# Patient Record
Sex: Female | Born: 1955
Health system: Southern US, Community
[De-identification: ages and names within clinical notes are randomized; demographics above are authoritative.]

## PROBLEM LIST (undated history)

## (undated) DIAGNOSIS — K219 Gastro-esophageal reflux disease without esophagitis: Secondary | ICD-10-CM

## (undated) HISTORY — DX: Gastro-esophageal reflux disease without esophagitis: K21.9

---

## 2010-05-20 HISTORY — PX: COLONOSCOPY: SHX174

## 2012-07-16 ENCOUNTER — Inpatient Hospital Stay (HOSPITAL_COMMUNITY): Admit: 2012-07-16 | Payer: Self-pay

## 2012-07-16 ENCOUNTER — Other Ambulatory Visit (HOSPITAL_COMMUNITY)
Admission: RE | Admit: 2012-07-16 | Discharge: 2012-07-16 | Disposition: A | Discharge status: Home / Self Care | Payer: 59 | Source: Ambulatory Visit | Attending: Obstetrics and Gynecology | Admitting: Obstetrics and Gynecology

## 2012-07-16 DIAGNOSIS — Z01419 Encounter for gynecological examination (general) (routine) without abnormal findings: Secondary | ICD-10-CM | POA: Insufficient documentation

## 2012-07-16 DIAGNOSIS — Z1151 Encounter for screening for human papillomavirus (HPV): Secondary | ICD-10-CM | POA: Insufficient documentation

## 2018-02-25 DIAGNOSIS — Z136 Encounter for screening for cardiovascular disorders: Secondary | ICD-10-CM | POA: Diagnosis not present

## 2018-02-25 DIAGNOSIS — Z0000 Encounter for general adult medical examination without abnormal findings: Secondary | ICD-10-CM | POA: Diagnosis not present

## 2018-04-01 DIAGNOSIS — H903 Sensorineural hearing loss, bilateral: Secondary | ICD-10-CM | POA: Diagnosis not present

## 2019-03-29 ENCOUNTER — Other Ambulatory Visit: Payer: Self-pay

## 2019-03-29 DIAGNOSIS — Z20822 Contact with and (suspected) exposure to covid-19: Secondary | ICD-10-CM

## 2019-03-30 LAB — NOVEL CORONAVIRUS, NAA: SARS-CoV-2, NAA: DETECTED — AB

## 2019-07-12 ENCOUNTER — Ambulatory Visit: Payer: Self-pay | Attending: Family

## 2019-07-12 DIAGNOSIS — Z23 Encounter for immunization: Secondary | ICD-10-CM

## 2019-07-12 NOTE — Progress Notes (Signed)
   Covid-19 Vaccination Clinic  Name:  Sandra Holt    MRN: [id number] DOB: 04-Jul-1955  07/12/2019  [name] was observed post Covid-19 immunization for 15 minutes without incidence. She was provided with Vaccine Information Sheet and instruction to access the V-Safe system.   [name] was instructed to call 911 with any severe reactions post vaccine: Marland Kitchen Difficulty breathing  . Swelling of your face and throat  . A fast heartbeat  . A bad rash all over your body  . Dizziness and weakness    Immunizations Administered    Name Date Dose VIS Date Route   Moderna COVID-19 Vaccine 07/12/2019  5:22 PM 0.5 mL 04/20/2019 Intramuscular   Manufacturer: Moderna   Lot: [id number]   NDC: 33825-053-97

## 2019-08-24 ENCOUNTER — Ambulatory Visit: Payer: Self-pay | Attending: Family

## 2019-08-24 DIAGNOSIS — Z23 Encounter for immunization: Secondary | ICD-10-CM

## 2019-08-24 NOTE — Progress Notes (Signed)
   Covid-19 Vaccination Clinic  Name:  Sandra Holt    MRN: [id number] DOB: March 18, 1956  08/24/2019  [name] was observed post Covid-19 immunization for 15 minutes without incident. She was provided with Vaccine Information Sheet and instruction to access the V-Safe system.   [name] was instructed to call 911 with any severe reactions post vaccine: Marland Kitchen Difficulty breathing  . Swelling of face and throat  . A fast heartbeat  . A bad rash all over body  . Dizziness and weakness   Immunizations Administered    Name Date Dose VIS Date Route   Moderna COVID-19 Vaccine 08/24/2019 12:12 PM 0.5 mL 04/20/2019 Intramuscular   Manufacturer: Moderna   Lot: [id number]   NDC: 52080-223-36

## 2019-12-31 ENCOUNTER — Encounter: Payer: Self-pay | Admitting: Gastroenterology

## 2020-02-04 ENCOUNTER — Encounter: Payer: Self-pay | Admitting: Internal Medicine

## 2020-02-04 ENCOUNTER — Ambulatory Visit (INDEPENDENT_AMBULATORY_CARE_PROVIDER_SITE_OTHER): Payer: 59 | Admitting: Internal Medicine

## 2020-02-04 ENCOUNTER — Other Ambulatory Visit: Payer: Self-pay

## 2020-02-04 VITALS — BP 122/82 | HR 86 | Temp 98.9°F | Ht 62.0 in | Wt 154.7 lb

## 2020-02-04 DIAGNOSIS — K219 Gastro-esophageal reflux disease without esophagitis: Secondary | ICD-10-CM | POA: Insufficient documentation

## 2020-02-04 DIAGNOSIS — R5383 Other fatigue: Secondary | ICD-10-CM | POA: Diagnosis not present

## 2020-02-04 MED ORDER — PANTOPRAZOLE SODIUM 40 MG PO TBEC: 40.0000 mg | DELAYED_RELEASE_TABLET | Freq: Every day | ORAL | 1 refills | Status: DC

## 2020-02-04 NOTE — Patient Instructions (Signed)
-  Gusto en conocerla!  -Haga cita para volver an ayunas para su examen fisico y laboratorios.  -Tomese el protonix de 40 mg una vez al dia en ayunas.

## 2020-02-04 NOTE — Progress Notes (Signed)
New Patient Office Visit     This visit occurred during the SARS-CoV-2 public health emergency.  Safety protocols were in place, including screening questions prior to the visit, additional usage of staff PPE, and extensive cleaning of exam room while observing appropriate contact time as indicated for disinfecting solutions.    CC/Reason for Visit: Establish care, discuss some acute concerns Previous PCP: None Last Visit: Unknown  HPI: Sandra Holt is a 64 y.o. female who is coming in today for the above mentioned reasons. She has no past medical history of significance. She has been having some lower back issues for years. No radiculopathy. For the last 8 to 10 months she has been complaining of increased epigastric pain with reflux. She has been using as needed PPI without significant relief. She is mainly Spanish-speaking, from the [place]. She had Covid in November 2020 and has been extremely fatigued ever since. She is overdue for many immunizations, all cancer screenings and lab work.   Past Medical/Surgical History: Past Medical History:  Diagnosis Date  . GERD (gastroesophageal reflux disease)     History reviewed. No pertinent surgical history.  Social History:  reports that she has never smoked. She has never used smokeless tobacco. She reports that she does not drink alcohol and does not use drugs.  Allergies: No Known Allergies  Family History:  No history of heart disease, cancer, stroke that she is aware of  Current Outpatient Medications:  Marland Kitchen  Multiple Vitamins-Minerals (CENTRUM WOMEN PO), Take by mouth., Disp: , Rfl:  .  pantoprazole (PROTONIX) 40 MG tablet, Take 1 tablet (40 mg total) by mouth daily., Disp: 90 tablet, Rfl: 1  Review of Systems:  Constitutional: Denies fever, chills, diaphoresis, appetite change and fatigue.  HEENT: Denies photophobia, eye pain, redness, hearing loss, ear pain, congestion, sore throat, rhinorrhea, sneezing,  mouth sores, trouble swallowing, neck pain, neck stiffness and tinnitus.   Respiratory: Denies SOB, DOE, cough, chest tightness,  and wheezing.   Cardiovascular: Denies chest pain, palpitations and leg swelling.  Gastrointestinal: Denies nausea, vomiting,  diarrhea, constipation, blood in stool and abdominal distention.  Genitourinary: Denies dysuria, urgency, frequency, hematuria, flank pain and difficulty urinating.  Endocrine: Denies: hot or cold intolerance, sweats, changes in hair or nails, polyuria, polydipsia. Musculoskeletal: Denies  joint swelling, arthralgias and gait problem.  Skin: Denies pallor, rash and wound.  Neurological: Denies dizziness, seizures, syncope, weakness, light-headedness, numbness and headaches.  Hematological: Denies adenopathy. Easy bruising, personal or family bleeding history  Psychiatric/Behavioral: Denies suicidal ideation, mood changes, confusion, nervousness, sleep disturbance and agitation    Physical Exam: Vitals:   02/04/20 1302  BP: 122/82  Pulse: 86  Temp: 98.9 F (37.2 C)  TempSrc: Oral  SpO2: 97%  Weight: 154 lb 11.2 oz (70.2 kg)  Height: 5\' 2"  (1.575 m)   Body mass index is 28.3 kg/m.   Constitutional: NAD, calm, comfortable Eyes: PERRL, lids and conjunctivae normal ENMT: Mucous membranes are moist.  Respiratory: clear to auscultation bilaterally, no wheezing, no crackles. Normal respiratory effort. No accessory muscle use.  Cardiovascular: Regular rate and rhythm, no murmurs / rubs / gallops. No extremity edema.   Neurologic grossly intact and nonfocal Psychiatric: Normal judgment and insight. Alert and oriented x 3. Normal mood.    Impression and Plan:  Gastroesophageal reflux disease without esophagitis  -Start daily PPI therapy with Protonix 40 mg daily. -She will return for follow-up.  Fatigue, unspecified type -She will make a follow-up appointment for labs and  physical. -I am especially concerned about her  hemoglobin, TSH, vitamin D and vitamin B12 levels.     Patient Instructions  -Gusto en conocerla!  -Haga cita para volver an ayunas para su examen fisico y laboratorios.  -Tomese el protonix de 40 mg una vez al dia en ayunas.     Chaya Jan, MD [facility]

## 2020-02-08 ENCOUNTER — Telehealth: Payer: Self-pay | Admitting: *Deleted

## 2020-02-08 MED ORDER — OMEPRAZOLE 20 MG PO CPDR: DELAYED_RELEASE_CAPSULE | ORAL | 1 refills | Status: DC

## 2020-02-08 NOTE — Telephone Encounter (Signed)
Ok to switch to omeprazole 20 mg, but she will need to take 40 mg a day so #60 per month. Ok to send 180 with a refill.

## 2020-02-08 NOTE — Telephone Encounter (Signed)
pantoprazole (PROTONIX) 40 MG tablet is not covered by her insurance.  Preferred products are:  Omeprazole 20 mg caps Lansoprazole 30 mg caps Rabeprazole 20 mg caps esomepra mag 20 mg caps Esomeprazole 20 mg

## 2020-02-29 ENCOUNTER — Ambulatory Visit: Payer: Self-pay | Admitting: Gastroenterology

## 2020-03-01 ENCOUNTER — Ambulatory Visit (INDEPENDENT_AMBULATORY_CARE_PROVIDER_SITE_OTHER): Payer: 59 | Admitting: Internal Medicine

## 2020-03-01 ENCOUNTER — Other Ambulatory Visit: Payer: Self-pay

## 2020-03-01 ENCOUNTER — Encounter: Payer: Self-pay | Admitting: Internal Medicine

## 2020-03-01 ENCOUNTER — Other Ambulatory Visit (HOSPITAL_COMMUNITY)
Admission: RE | Admit: 2020-03-01 | Discharge: 2020-03-01 | Disposition: A | Discharge status: Home / Self Care | Payer: 59 | Source: Ambulatory Visit | Attending: Internal Medicine | Admitting: Internal Medicine

## 2020-03-01 VITALS — BP 130/80 | HR 80 | Temp 98.5°F | Resp 16 | Ht 62.0 in | Wt 151.6 lb

## 2020-03-01 DIAGNOSIS — R5383 Other fatigue: Secondary | ICD-10-CM

## 2020-03-01 DIAGNOSIS — K219 Gastro-esophageal reflux disease without esophagitis: Secondary | ICD-10-CM

## 2020-03-01 DIAGNOSIS — Z0000 Encounter for general adult medical examination without abnormal findings: Secondary | ICD-10-CM | POA: Diagnosis not present

## 2020-03-01 DIAGNOSIS — Z1231 Encounter for screening mammogram for malignant neoplasm of breast: Secondary | ICD-10-CM | POA: Diagnosis not present

## 2020-03-01 DIAGNOSIS — Z124 Encounter for screening for malignant neoplasm of cervix: Secondary | ICD-10-CM

## 2020-03-01 DIAGNOSIS — Z1211 Encounter for screening for malignant neoplasm of colon: Secondary | ICD-10-CM

## 2020-03-01 NOTE — Patient Instructions (Signed)
-Nice seeing you today!!  -Lab work today; will notify you once results are available.  -Mammogram and colonoscopy will be requested today.  -Shingles vaccine at your pharmacy.  -Schedule follow up in 6 months.   Cuidados preventivos en las mujeres de 20 a 30 aos de edad Preventive Care 1-63 Years Old, Female Los cuidados preventivos hacen referencia a las opciones en cuanto a las visitas al mdico y al estilo de vida, las cuales pueden promover la salud y Musician. Esto puede comprender lo siguiente:  Un examen fsico anual. Esto tambin se puede llamar control de bienestar anual.  Visitas regulares al dentista y exmenes oculares.  Vacunas.  Estudios para Engineer, building services.  Opciones saludables de estilo de vida, como seguir una dieta saludable, hacer ejercicio regularmente, no usar drogas ni productos que contengan nicotina y tabaco, y limitar el consumo de alcohol. Qu puedo esperar para mi visita de cuidado preventivo? Examen fsico El mdico revisar lo siguiente:  IT consultant y [place]. Esto se puede usar para calcular el ndice de masa corporal ([place]), que indica si tiene un peso saludable.  Frecuencia cardaca y presin arterial.  Piel para detectar manchas anormales. Asesoramiento Su mdico puede preguntarle acerca de:  Consumo de tabaco, alcohol y drogas.  Su bienestar emocional.  El bienestar en el hogar y sus relaciones personales.  Su actividad sexual.  Sus hbitos de alimentacin.  Su trabajo y [employer] laboral.  Mtodos anticonceptivos.  Su ciclo menstrual.  Sus antecedentes de Media planner. Qu vacunas necesito?  [place] antigripal  Se recomienda aplicarse esta vacuna todos los [place]. Vacuna contra el ttanos, difteria y tos ferina (Tdap)  Es posible que tenga que aplicarse un refuerzo contra el ttanos y la difteria (DT) cada 10aos. Vacuna contra la varicela  Es posible que tenga que aplicrsela si no recibi esta  vacuna. Vacuna contra el herpes zster (culebrilla)  Es posible que la necesite despus de los 38 aos de [place]. Vacuna contra el sarampin, rubola y paperas (SRP)  Es posible que necesite aplicarse al menos una dosis de la vacuna SRP si naci despus de [phone]. Podra tambin necesitar una segunda dosis. Vacuna antineumoccica conjugada (PCV13)  Puede necesitar esta vacuna si tiene determinadas enfermedades y no se vacun anteriormente. Edward Jolly antineumoccica de polisacridos (PPSV23)  Quizs tenga que aplicarse una o dos dosis si fuma o si tiene determinadas afecciones. Edward Jolly antimeningoccica conjugada (MenACWY)  Puede necesitar esta vacuna si tiene determinadas afecciones. Vacuna contra la hepatitis A  Es posible que necesite esta vacuna si tiene ciertas afecciones o si viaja o trabaja en lugares en los que podra estar expuesta a la hepatitis A. Vacuna contra la hepatitis B  Es posible que necesite esta vacuna si tiene ciertas afecciones o si viaja o trabaja en lugares en los que podra estar expuesta a la hepatitis B. Vacuna antihaemophilus influenzae tipo B (Hib)  Puede necesitar esta vacuna si tiene determinadas afecciones. Vacuna contra el virus del papiloma humano (VPH)  Si el mdico se lo recomienda, Research scientist (physical sciences) tres dosis a lo largo de 6 meses. Puede recibir las vacunas en forma de dosis individuales o en forma de dos o ms vacunas juntas en la misma inyeccin (vacunas combinadas). Hable con su mdico [employer] y beneficios de las vacunas combinadas. Qu pruebas necesito? Anlisis de [employer] de lpidos y colesterol. Estos se pueden verificar cada 5 aos o, con ms frecuencia, si usted tiene ms de 27 aos de edad.  Anlisis de hepatitisC.  Anlisis de hepatitisB. Pruebas de deteccin  Pruebas de deteccin de cncer de pulmn. Es posible que se le realice esta prueba de deteccin a partir de los 33 aos de edad, si ha fumado durante 30 aos un  paquete diario y sigue fumando o dej el hbito en algn momento en los ltimos 15 aos.  Pruebas de Programme researcher, broadcasting/film/video de Surveyor, minerals. Todos los adultos a partir de los 42 aos de edad y [place] 70 aos de edad deben hacerse esta prueba de deteccin. El mdico puede recomendarle las pruebas de deteccin a partir de los 40 aos de edad si corre un mayor riesgo. Le realizarn pruebas cada 1 a 10 aos, segn los [place] y el tipo de prueba de Programme researcher, broadcasting/film/video.  Pruebas de deteccin de la diabetes. Esto se Set designer un control del azcar en la sangre (glucosa) despus de no haber comido durante un periodo de tiempo (ayuno). Es posible que se le realice esta prueba cada 1 a 3 aos.  Mamografa. Se puede realizar cada 1 o 2 aos. Hable con su mdico sobre cundo debe comenzar a Engineer, manufacturing de [place] regular. Esto depende de si tiene antecedentes familiares de cncer de mama o no.  Pruebas de deteccin de cncer relacionado con las mutaciones del BRCA. Es posible que se las deba realizar si tiene antecedentes de cncer de mama, de ovario, de trompas o peritoneal.  Examen plvico y prueba de Papanicolaou. Esto se puede realizar cada 8aos a Renato Gails de los 21aos de edad. A partir de los 30 aos, esto se puede Optometrist cada 5 aos si usted se realiza una prueba de Papanicolaou en combinacin con una prueba de deteccin del virus del papiloma humano (VPH). Otras pruebas  Anlisis de enfermedades de transmisin sexual (ETS).  Densitometra sea. Esto se realiza para detectar osteoporosis. Se le puede realizar este examen de deteccin si tiene un riesgo alto de tener osteoporosis. Siga estas instrucciones en su casa: Comida y bebida  Siga una dieta que incluya frutas frescas y verduras, cereales integrales, lcteos descremados y protenas magras.  Tome los suplementos vitamnicos y [employer] se lo haya indicado el mdico.  No beba alcohol si: ? Su mdico le indica no hacerlo. ? Est  embarazada, puede estar embarazada o est tratando de quedar embarazada.  Si bebe alcohol: ? Limite la cantidad que consume de 0 a 1 medida por da. ? Est atenta a la cantidad de alcohol que hay en las bebidas que toma. En los [place], una medida equivale a una botella de cerveza de 12oz (367ml), un vaso de vino de 5oz (13ml) o un vaso de una bebida alcohlica de alta graduacin de 1oz (66ml). Estilo de [employer] y las encas a diario.  Mantngase activa. Haga al menos 61minutos de ejercicio 5o ms [employer].  No consuma ningn producto que contenga nicotina o tabaco, como cigarrillos, cigarrillos electrnicos y tabaco de Higher education careers adviser. Si necesita ayuda para dejar de fumar, consulte al mdico.  Si es sexualmente activa, practique sexo seguro. Use un condn u otra forma de mtodo anticonceptivo (anticonceptivos) a fin de Environmental health practitioner e ITS (infecciones de transmisin sexual).  Si el mdico se lo indic, tome una dosis baja de aspirina diariamente a partir de los 24 aos de [place]. Cundo volver?  Visite al mdico una vez al ao para una visita de control.  Pregntele al mdico con qu frecuencia debe realizarse un control de la vista y los dientes.  Mantenga su esquema de vacunacin al da. Esta informacin no tiene Marine scientist el consejo del mdico. Asegrese de hacerle al mdico cualquier pregunta que tenga. Document Revised: 02/19/2018 Document Reviewed: 02/19/2018 Elsevier Patient Education  [place].

## 2020-03-01 NOTE — Progress Notes (Signed)
Established Patient Office Visit     This visit occurred during the SARS-CoV-2 public health emergency.  Safety protocols were in place, including screening questions prior to the visit, additional usage of staff PPE, and extensive cleaning of exam room while observing appropriate contact time as indicated for disinfecting solutions.    CC/Reason for Visit: Annual preventive exam  HPI: Sandra Holt is a 64 y.o. female who is coming in today for the above mentioned reasons. Past Medical History is significant for: Recently diagnosed GERD who has started Protonix and is feeling much improved.  She is overdue for mammogram, Pap smear, colonoscopy.  She recently had her flu vaccine.  She has no acute complaints today.   Past Medical/Surgical History: Past Medical History:  Diagnosis Date  . GERD (gastroesophageal reflux disease)     No past surgical history on file.  Social History:  reports that she has never smoked. She has never used smokeless tobacco. She reports that she does not drink alcohol and does not use drugs.  Allergies: No Known Allergies  Family History:  No history of heart disease, cancer, stroke that she is aware of.  Current Outpatient Medications:  Marland Kitchen  Multiple Vitamins-Minerals (CENTRUM WOMEN PO), Take by mouth., Disp: , Rfl:  .  omeprazole (PRILOSEC) 20 MG capsule, Take 2 capsules daily, Disp: 180 capsule, Rfl: 1  Review of Systems:  Constitutional: Denies fever, chills, diaphoresis, appetite change and fatigue.  HEENT: Denies photophobia, eye pain, redness, hearing loss, ear pain, congestion, sore throat, rhinorrhea, sneezing, mouth sores, trouble swallowing, neck pain, neck stiffness and tinnitus.   Respiratory: Denies SOB, DOE, cough, chest tightness,  and wheezing.   Cardiovascular: Denies chest pain, palpitations and leg swelling.  Gastrointestinal: Denies nausea, vomiting, abdominal pain, diarrhea, constipation, blood in stool and  abdominal distention.  Genitourinary: Denies dysuria, urgency, frequency, hematuria, flank pain and difficulty urinating.  Endocrine: Denies: hot or cold intolerance, sweats, changes in hair or nails, polyuria, polydipsia. Musculoskeletal: Denies myalgias, back pain, joint swelling, arthralgias and gait problem.  Skin: Denies pallor, rash and wound.  Neurological: Denies dizziness, seizures, syncope, weakness, light-headedness, numbness and headaches.  Hematological: Denies adenopathy. Easy bruising, personal or family bleeding history  Psychiatric/Behavioral: Denies suicidal ideation, mood changes, confusion, nervousness, sleep disturbance and agitation    Physical Exam: Vitals:   03/01/20 1117  BP: 130/80  Pulse: 80  Resp: 16  Temp: 98.5 F (36.9 C)  TempSrc: Oral  SpO2: 99%  Weight: 151 lb 9.6 oz (68.8 kg)  Height: $Remove'5\' 2"'UEEIRFm$  (1.575 m)    Body mass index is 27.73 kg/m.   Constitutional: NAD, calm, comfortable Eyes: PERRL, lids and conjunctivae normal, wears corrective lenses ENMT: Mucous membranes are moist. Posterior pharynx clear of any exudate or lesions. Normal dentition. Tympanic membrane is pearly white, no erythema or bulging. Neck: normal, supple, no masses, no thyromegaly Respiratory: clear to auscultation bilaterally, no wheezing, no crackles. Normal respiratory effort. No accessory muscle use.  Cardiovascular: Regular rate and rhythm, no murmurs / rubs / gallops. No extremity edema. 2+ pedal pulses. No carotid bruits.  Abdomen: no tenderness, no masses palpated. No hepatosplenomegaly. Bowel sounds positive.  Musculoskeletal: no clubbing / cyanosis. No joint deformity upper and lower extremities. Good ROM, no contractures. Normal muscle tone.  Skin: no rashes, lesions, ulcers. No induration Neurologic: CN 2-12 grossly intact. Sensation intact, DTR normal. Strength 5/5 in all 4.  Psychiatric: Normal judgment and insight. Alert and oriented x 3. Normal mood.  Impression and Plan:  Encounter for preventive health examination -She has routine eye and dental care. -Immunizations are up-to-date including Covid, flu.  She is due for shingles but she prefers to receive at her workplace. -Screening labs today. -Healthy lifestyle discussed in detail. -Pap smear done in office today. -Referrals for mammogram and colonoscopy have been requested.  Gastroesophageal reflux disease without esophagitis -Doing well on daily PPI therapy.  Colon cancer screening  - Plan: Ambulatory referral to Gastroenterology  Encounter for screening mammogram for malignant neoplasm of breast  - Plan: MM Digital Screening  Cervical cancer screening  - Plan: PAP [[place]]  Fatigue, unspecified type  - Plan: CBC with Differential/Platelet, Comprehensive metabolic panel, TSH, Vitamin B12, VITAMIN D 25 Hydroxy (Vit-D Deficiency, Fractures), VITAMIN D 25 Hydroxy (Vit-D Deficiency, Fractures), Vitamin B12, TSH, Comprehensive metabolic panel, CBC with Differential/Platelet -Possibly could be long-haul Covid syndrome.    Patient Instructions  -Nice seeing you today!!  -Lab work today; will notify you once results are available.  -Mammogram and colonoscopy will be requested today.  -Shingles vaccine at your pharmacy.  -Schedule follow up in 6 months.   Cuidados preventivos en las mujeres de 40 a 64 aos de edad Preventive Care 6-83 Years Old, Female Los cuidados preventivos hacen referencia a las opciones en cuanto a las visitas al mdico y al estilo de vida, las cuales pueden promover la salud y Counsellor. Esto puede comprender lo siguiente:  Un examen fsico anual. Esto tambin se puede llamar control de bienestar anual.  Visitas regulares al dentista y exmenes oculares.  Vacunas.  Estudios para Hospital doctor.  Opciones saludables de estilo de vida, como seguir una dieta saludable, hacer ejercicio regularmente, no usar drogas ni  productos que contengan nicotina y tabaco, y limitar el consumo de alcohol. Qu puedo esperar para mi visita de cuidado preventivo? Examen fsico El mdico revisar lo siguiente:  Diplomatic Services operational officer y [place]. Esto se puede usar para calcular el ndice de masa corporal (IMC), que indica si tiene un peso saludable.  Frecuencia cardaca y presin arterial.  Piel para detectar manchas anormales. Asesoramiento Su mdico puede preguntarle acerca de:  Consumo de tabaco, alcohol y drogas.  Su bienestar emocional.  El bienestar en el hogar y sus relaciones personales.  Su actividad sexual.  Sus hbitos de alimentacin.  Su trabajo y [place] laboral.  Mtodos anticonceptivos.  Su ciclo menstrual.  Sus antecedentes de Psychiatrist. Qu vacunas necesito?  [place] antigripal  Se recomienda aplicarse esta vacuna todos los [place]. Vacuna contra el ttanos, difteria y tos ferina (Tdap)  Es posible que tenga que aplicarse un refuerzo contra el ttanos y la difteria (DT) cada 10aos. Vacuna contra la varicela  Es posible que tenga que aplicrsela si no recibi esta vacuna. Vacuna contra el herpes zster (culebrilla)  Es posible que la necesite despus de los 60 aos de [place]. Vacuna contra el sarampin, rubola y paperas (SRP)  Es posible que necesite aplicarse al menos una dosis de la vacuna SRP si naci despus de [id number]. Podra tambin necesitar una segunda dosis. Vacuna antineumoccica conjugada (PCV13)  Puede necesitar esta vacuna si tiene determinadas enfermedades y no se vacun anteriormente. Madilyn Fireman antineumoccica de polisacridos (PPSV23)  Quizs tenga que aplicarse una o dos dosis si fuma o si tiene determinadas afecciones. Madilyn Fireman antimeningoccica conjugada (MenACWY)  Puede necesitar esta vacuna si tiene determinadas afecciones. Vacuna contra la hepatitis A  Es posible que necesite esta vacuna si tiene ciertas afecciones o si viaja o trabaja en  lugares en los que podra estar  expuesta a la hepatitis A. Vacuna contra la hepatitis B  Es posible que necesite esta vacuna si tiene ciertas afecciones o si viaja o trabaja en lugares en los que podra estar expuesta a la hepatitis B. Vacuna antihaemophilus influenzae tipo B (Hib)  Puede necesitar esta vacuna si tiene determinadas afecciones. Vacuna contra el virus del papiloma humano (VPH)  Si el mdico se lo recomienda, Engineer, materials tres dosis a lo largo de 6 meses. Puede recibir las vacunas en forma de dosis individuales o en forma de dos o ms vacunas juntas en la misma inyeccin (vacunas combinadas). Hable con su mdico [employer] y beneficios de las vacunas [place]. Qu pruebas necesito? Anlisis de [employer] de lpidos y colesterol. Estos se pueden verificar cada 5 aos o, con ms frecuencia, si usted tiene ms de 50 aos de edad.  Anlisis de hepatitisC.  Anlisis de hepatitisB. Pruebas de deteccin  Pruebas de deteccin de cncer de pulmn. Es posible que se le realice esta prueba de deteccin a partir de los 55 aos de edad, si ha fumado durante 30 aos un paquete diario y sigue fumando o dej el hbito en algn momento en los ltimos 15 aos.  Pruebas de Airline pilot de Building services engineer. Todos los adultos a partir de los 50 aos de edad y H[place] 75 aos de edad deben hacerse esta prueba de deteccin. El mdico puede recomendarle las pruebas de deteccin a partir de los 45 aos de edad si corre un mayor riesgo. Le realizarn pruebas cada 1 a 10 aos, segn los [place] y el tipo de prueba de Airline pilot.  Pruebas de deteccin de la diabetes. Esto se Physiological scientist un control del azcar en la sangre (glucosa) despus de no haber comido durante un periodo de tiempo (ayuno). Es posible que se le realice esta prueba cada 1 a 3 aos.  Mamografa. Se puede realizar cada 1 o 2 aos. Hable con su mdico sobre cundo debe comenzar a Health and safety inspector de [place] regular. Esto depende de si  tiene antecedentes familiares de cncer de mama o no.  Pruebas de deteccin de cncer relacionado con las mutaciones del BRCA. Es posible que se las deba realizar si tiene antecedentes de cncer de mama, de ovario, de trompas o peritoneal.  Examen plvico y prueba de Papanicolaou. Esto se puede realizar cada 3aos a [place] de los 21aos de [address]. A partir de los 30 aos, esto se puede Education officer, environmental cada 5 aos si usted se realiza una prueba de Papanicolaou en combinacin con una prueba de deteccin del virus del papiloma humano (VPH). Otras pruebas  Anlisis de enfermedades de transmisin sexual (ETS).  Densitometra sea. Esto se realiza para detectar osteoporosis. Se le puede realizar este examen de deteccin si tiene un riesgo alto de tener osteoporosis. Siga estas instrucciones en su casa: Comida y bebida  Siga una dieta que incluya frutas frescas y verduras, cereales integrales, lcteos descremados y protenas magras.  Tome los suplementos vitamnicos y [employer] se lo haya indicado el mdico.  No beba alcohol si: ? Su mdico le indica no hacerlo. ? Est embarazada, puede estar embarazada o est tratando de quedar embarazada.  Si bebe alcohol: ? Limite la cantidad que consume de 0 a 1 medida por da. ? Est atenta a la cantidad de alcohol que hay en las bebidas que toma. En los [place], una medida equivale a una botella de cerveza de 12oz ( ), un vaso  de vino de 5oz (153ml) o un vaso de una bebida alcohlica de alta graduacin de 1oz (29ml). Estilo de [employer] y las encas a diario.  Mantngase activa. Haga al menos 26minutos de ejercicio 5o ms [employer].  No consuma ningn producto que contenga nicotina o tabaco, como cigarrillos, cigarrillos electrnicos y tabaco de Higher education careers adviser. Si necesita ayuda para dejar de fumar, consulte al mdico.  Si es sexualmente activa, practique sexo seguro. Use un condn u otra forma de mtodo anticonceptivo  (anticonceptivos) a fin de Environmental health practitioner e ITS (infecciones de transmisin sexual).  Si el mdico se lo indic, tome una dosis baja de aspirina diariamente a partir de los 28 aos de [place]. Cundo volver?  Visite al mdico una vez al ao para una visita de control.  Pregntele al mdico con qu frecuencia debe realizarse un control de la vista y los dientes.  Mantenga su esquema de vacunacin al da. Esta informacin no tiene Marine scientist el consejo del mdico. Asegrese de hacerle al mdico cualquier pregunta que tenga. Document Revised: 02/19/2018 Document Reviewed: 02/19/2018 Elsevier Patient Education  2020 [place], MD [facility]

## 2020-03-02 ENCOUNTER — Encounter: Payer: Self-pay | Admitting: Internal Medicine

## 2020-03-02 ENCOUNTER — Other Ambulatory Visit: Payer: Self-pay | Admitting: Internal Medicine

## 2020-03-02 DIAGNOSIS — E785 Hyperlipidemia, unspecified: Secondary | ICD-10-CM | POA: Insufficient documentation

## 2020-03-02 DIAGNOSIS — E559 Vitamin D deficiency, unspecified: Secondary | ICD-10-CM | POA: Insufficient documentation

## 2020-03-02 LAB — COMPREHENSIVE METABOLIC PANEL
AG Ratio: 1.7 (calc) (ref 1.0–2.5)
ALT: 16 U/L (ref 6–29)
AST: 18 U/L (ref 10–35)
Albumin: 4.6 g/dL (ref 3.6–5.1)
Alkaline phosphatase (APISO): 50 U/L (ref 37–153)
BUN: 16 mg/dL (ref 7–25)
CO2: 29 mmol/L (ref 20–32)
Calcium: 9.6 mg/dL (ref 8.6–10.4)
Chloride: 103 mmol/L (ref 98–110)
Creat: 0.7 mg/dL (ref 0.50–0.99)
Globulin: 2.7 g/dL (calc) (ref 1.9–3.7)
Glucose, Bld: 93 mg/dL (ref 65–99)
Potassium: 3.3 mmol/L — ABNORMAL LOW (ref 3.5–5.3)
Sodium: 141 mmol/L (ref 135–146)
Total Bilirubin: 0.7 mg/dL (ref 0.2–1.2)
Total Protein: 7.3 g/dL (ref 6.1–8.1)

## 2020-03-02 LAB — CBC WITH DIFFERENTIAL/PLATELET
Absolute Monocytes: 312 cells/uL (ref 200–950)
Basophils Absolute: 21 cells/uL (ref 0–200)
Basophils Relative: 0.5 %
Eosinophils Absolute: 70 cells/uL (ref 15–500)
Eosinophils Relative: 1.7 %
HCT: 42.1 % (ref 35.0–45.0)
Hemoglobin: 13.6 g/dL (ref 11.7–15.5)
Lymphs Abs: 1324 cells/uL (ref 850–3900)
MCH: 29.2 pg (ref 27.0–33.0)
MCHC: 32.3 g/dL (ref 32.0–36.0)
MCV: 90.5 fL (ref 80.0–100.0)
MPV: 11.2 fL (ref 7.5–12.5)
Monocytes Relative: 7.6 %
Neutro Abs: 2374 cells/uL (ref 1500–7800)
Neutrophils Relative %: 57.9 %
Platelets: 293 10*3/uL (ref 140–400)
RBC: 4.65 10*6/uL (ref 3.80–5.10)
RDW: 12.3 % (ref 11.0–15.0)
Total Lymphocyte: 32.3 %
WBC: 4.1 10*3/uL (ref 3.8–10.8)

## 2020-03-02 LAB — LIPID PANEL
Cholesterol: 227 mg/dL — ABNORMAL HIGH (ref <200)
HDL: 60 mg/dL (ref >=50)
LDL Cholesterol (Calc): 142 mg/dL (calc) — ABNORMAL HIGH
Non-HDL Cholesterol (Calc): 167 mg/dL (calc) — ABNORMAL HIGH (ref <130)
Total CHOL/HDL Ratio: 3.8 (calc) (ref <5.0)
Triglycerides: 124 mg/dL (ref <150)

## 2020-03-02 LAB — HEMOGLOBIN A1C
Hgb A1c MFr Bld: 5.5 % of total Hgb (ref <5.7)
Mean Plasma Glucose: 111 (calc)
eAG (mmol/L): 6.2 (calc)

## 2020-03-02 LAB — TSH: TSH: 1.26 mIU/L (ref 0.40–4.50)

## 2020-03-02 LAB — VITAMIN B12: Vitamin B-12: 474 pg/mL (ref 200–1100)

## 2020-03-02 LAB — VITAMIN D 25 HYDROXY (VIT D DEFICIENCY, FRACTURES): Vit D, 25-Hydroxy: 28 ng/mL — ABNORMAL LOW (ref 30–100)

## 2020-03-02 MED ORDER — ATORVASTATIN CALCIUM 20 MG PO TABS: 20.0000 mg | ORAL_TABLET | Freq: Every day | ORAL | 1 refills | Status: DC

## 2020-03-02 MED ORDER — VITAMIN D (ERGOCALCIFEROL) 1.25 MG (50000 UNIT) PO CAPS: 50000.0000 [IU] | ORAL_CAPSULE | ORAL | 0 refills | Status: AC

## 2020-03-06 LAB — CYTOLOGY - PAP
Comment: NEGATIVE
Diagnosis: NEGATIVE
High risk HPV: NEGATIVE

## 2020-03-07 ENCOUNTER — Other Ambulatory Visit: Payer: Self-pay | Admitting: Internal Medicine

## 2020-03-07 DIAGNOSIS — E559 Vitamin D deficiency, unspecified: Secondary | ICD-10-CM

## 2020-03-07 DIAGNOSIS — E785 Hyperlipidemia, unspecified: Secondary | ICD-10-CM

## 2020-04-19 ENCOUNTER — Ambulatory Visit
Admission: RE | Admit: 2020-04-19 | Discharge: 2020-04-19 | Disposition: A | Discharge status: Home / Self Care | Payer: 59 | Source: Ambulatory Visit | Attending: Internal Medicine | Admitting: Internal Medicine

## 2020-04-19 ENCOUNTER — Other Ambulatory Visit: Payer: Self-pay

## 2020-04-19 DIAGNOSIS — Z1231 Encounter for screening mammogram for malignant neoplasm of breast: Secondary | ICD-10-CM

## 2020-04-21 ENCOUNTER — Other Ambulatory Visit: Payer: Self-pay | Admitting: Internal Medicine

## 2020-04-21 DIAGNOSIS — R928 Other abnormal and inconclusive findings on diagnostic imaging of breast: Secondary | ICD-10-CM

## 2020-04-22 ENCOUNTER — Other Ambulatory Visit: Payer: 59

## 2020-04-22 ENCOUNTER — Other Ambulatory Visit: Payer: Self-pay

## 2020-04-22 ENCOUNTER — Ambulatory Visit
Admission: RE | Admit: 2020-04-22 | Discharge: 2020-04-22 | Disposition: A | Discharge status: Home / Self Care | Payer: 59 | Source: Ambulatory Visit | Attending: Internal Medicine | Admitting: Internal Medicine

## 2020-04-22 DIAGNOSIS — R928 Other abnormal and inconclusive findings on diagnostic imaging of breast: Secondary | ICD-10-CM

## 2020-05-23 ENCOUNTER — Other Ambulatory Visit: Payer: Self-pay | Admitting: Internal Medicine

## 2020-05-23 DIAGNOSIS — E559 Vitamin D deficiency, unspecified: Secondary | ICD-10-CM

## 2020-07-20 ENCOUNTER — Encounter: Payer: Self-pay | Admitting: Internal Medicine

## 2020-07-20 ENCOUNTER — Other Ambulatory Visit: Payer: Self-pay

## 2020-07-20 ENCOUNTER — Telehealth: Payer: Self-pay | Admitting: Internal Medicine

## 2020-07-20 ENCOUNTER — Ambulatory Visit (INDEPENDENT_AMBULATORY_CARE_PROVIDER_SITE_OTHER): Payer: 59 | Admitting: Internal Medicine

## 2020-07-20 VITALS — BP 110/80 | HR 88 | Temp 98.6°F | Wt 152.5 lb

## 2020-07-20 DIAGNOSIS — N200 Calculus of kidney: Secondary | ICD-10-CM

## 2020-07-20 DIAGNOSIS — N3000 Acute cystitis without hematuria: Secondary | ICD-10-CM

## 2020-07-20 LAB — POCT URINALYSIS DIPSTICK
Bilirubin, UA: NEGATIVE
Blood, UA: NEGATIVE
Glucose, UA: NEGATIVE
Ketones, UA: POSITIVE
Nitrite, UA: NEGATIVE
Protein, UA: POSITIVE — AB
Spec Grav, UA: 1.015 (ref 1.010–1.025)
Urobilinogen, UA: 0.2 E.U./dL
pH, UA: 6.5 (ref 5.0–8.0)

## 2020-07-20 MED ORDER — SULFAMETHOXAZOLE-TRIMETHOPRIM 800-160 MG PO TABS: 1.0000 | ORAL_TABLET | Freq: Two times a day (BID) | ORAL | 0 refills | Status: AC

## 2020-07-20 NOTE — Telephone Encounter (Signed)
Patient said that she was returning a call from our office. I didn't see any notes so I advised her that she will receive a call back.

## 2020-07-20 NOTE — Addendum Note (Signed)
Addended by: Leonette Nutting on: 07/20/2020 04:15 PM   Modules accepted: Orders

## 2020-07-20 NOTE — Telephone Encounter (Signed)
LMVM for the patient to contact the office and schedule an office appointment

## 2020-07-20 NOTE — Progress Notes (Signed)
Acute office Visit     This visit occurred during the SARS-CoV-2 public health emergency.  Safety protocols were in place, including screening questions prior to the visit, additional usage of staff PPE, and extensive cleaning of exam room while observing appropriate contact time as indicated for disinfecting solutions.    CC/Reason for Visit: "Hard spot on my vulva"  HPI: Sandra Holt is a 65 y.o. female who is coming in today for the above mentioned reasons. She called this morning for an acute visit after noticing last night while having a shower that she had a hard lump in her vulva. Later this morning while urinating she felt something splashed into the toilet and she actually collected it and put it in a Ziploc bag and brought it with her today. She has had no dysuria, no frequency, no CVA tenderness, no back pain, no gross hematuria. No fever. She feels well.  Past Medical/Surgical History: Past Medical History:  Diagnosis Date  . GERD (gastroesophageal reflux disease)     No past surgical history on file.  Social History:  reports that she has never smoked. She has never used smokeless tobacco. She reports that she does not drink alcohol and does not use drugs.  Allergies: No Known Allergies  Family History:  Family History  Problem Relation Age of Onset  . Breast cancer Cousin      Current Outpatient Medications:  .  atorvastatin (LIPITOR) 20 MG tablet, Take 1 tablet (20 mg total) by mouth daily., Disp: 90 tablet, Rfl: 1 .  Multiple Vitamins-Minerals (CENTRUM WOMEN PO), Take by mouth., Disp: , Rfl:  .  sulfamethoxazole-trimethoprim (BACTRIM DS) 800-160 MG tablet, Take 1 tablet by mouth 2 (two) times daily for 3 days., Disp: 6 tablet, Rfl: 0 .  omeprazole (PRILOSEC) 20 MG capsule, Take 2 capsules daily (Patient not taking: Reported on 07/20/2020), Disp: 180 capsule, Rfl: 1  Review of Systems:  Constitutional: Denies fever, chills, diaphoresis,  appetite change and fatigue.  HEENT: Denies photophobia, eye pain, redness, hearing loss, ear pain, congestion, sore throat, rhinorrhea, sneezing, mouth sores, trouble swallowing, neck pain, neck stiffness and tinnitus.   Respiratory: Denies SOB, DOE, cough, chest tightness,  and wheezing.   Cardiovascular: Denies chest pain, palpitations and leg swelling.  Gastrointestinal: Denies nausea, vomiting, abdominal pain, diarrhea, constipation, blood in stool and abdominal distention.  Genitourinary: Denies dysuria, urgency, frequency, hematuria, flank pain and difficulty urinating.  Endocrine: Denies: hot or cold intolerance, sweats, changes in hair or nails, polyuria, polydipsia. Musculoskeletal: Denies myalgias, back pain, joint swelling, arthralgias and gait problem.  Skin: Denies pallor, rash and wound.  Neurological: Denies dizziness, seizures, syncope, weakness, light-headedness, numbness and headaches.  Hematological: Denies adenopathy. Easy bruising, personal or family bleeding history  Psychiatric/Behavioral: Denies suicidal ideation, mood changes, confusion, nervousness, sleep disturbance and agitation    Physical Exam: Vitals:   07/20/20 1529  BP: 110/80  Pulse: 88  Temp: 98.6 F (37 C)  TempSrc: Oral  SpO2: 98%  Weight: 152 lb 8 oz (69.2 kg)    Body mass index is 27.89 kg/m.   Constitutional: NAD, calm, comfortable Eyes: PERRL, lids and conjunctivae normal, wears corrective lenses ENMT: Mucous membranes are moist.  Neurologic: Grossly intact and nonfocal Psychiatric: Normal judgment and insight. Alert and oriented x 3. Normal mood.    Impression and Plan:  Kidney stone Acute cystitis without hematuria   -What she brought in likely represents a kidney stone. She is not having flank pain so  I do not believe we need to image her kidneys. -Urine does show 3+ leukocytes, no blood. -She will be treated with double strength Bactrim for 3 days and it will be sent off for  urine culture.    Chaya Jan, MD [facility] at [facility]

## 2020-07-21 LAB — URINALYSIS, ROUTINE W REFLEX MICROSCOPIC
Bilirubin Urine: NEGATIVE
Hgb urine dipstick: NEGATIVE
Nitrite: NEGATIVE
RBC / HPF: NONE SEEN (ref 0–?)
Specific Gravity, Urine: 1.015 (ref 1.000–1.030)
Total Protein, Urine: NEGATIVE
Urine Glucose: NEGATIVE
Urobilinogen, UA: 0.2 (ref 0.0–1.0)
pH: 7 (ref 5.0–8.0)

## 2020-07-21 LAB — URINE CULTURE
MICRO NUMBER:: 11603636
SPECIMEN QUALITY:: ADEQUATE

## 2021-01-16 ENCOUNTER — Other Ambulatory Visit: Payer: Self-pay

## 2021-01-17 ENCOUNTER — Ambulatory Visit (INDEPENDENT_AMBULATORY_CARE_PROVIDER_SITE_OTHER): Payer: 59 | Admitting: Internal Medicine

## 2021-01-17 ENCOUNTER — Encounter: Payer: Self-pay | Admitting: Internal Medicine

## 2021-01-17 VITALS — BP 120/80 | HR 78 | Temp 98.2°F | Wt 154.0 lb

## 2021-01-17 DIAGNOSIS — M778 Other enthesopathies, not elsewhere classified: Secondary | ICD-10-CM

## 2021-01-17 NOTE — Progress Notes (Signed)
     Acute office Visit     This visit occurred during the SARS-CoV-2 public health emergency.  Safety protocols were in place, including screening questions prior to the visit, additional usage of staff PPE, and extensive cleaning of exam room while observing appropriate contact time as indicated for disinfecting solutions.    CC/Reason for Visit: Right elbow pain  HPI: Sandra Holt is a 65 y.o. female who is coming in today for the above mentioned reasons.  For the past few weeks she has been having right elbow pain right above the lateral epicondyle.  She works at [employer] and has been working extra hours, she feels like this is probably the reason.  No fever, no redness.  Past Medical/Surgical History: Past Medical History:  Diagnosis Date   GERD (gastroesophageal reflux disease)     No past surgical history on file.  Social History:  reports that she has never smoked. She has never used smokeless tobacco. She reports that she does not drink alcohol and does not use drugs.  Allergies: No Known Allergies  Family History:  Family History  Problem Relation Age of Onset   Breast cancer Cousin      Current Outpatient Medications:    atorvastatin (LIPITOR) 20 MG tablet, Take 1 tablet (20 mg total) by mouth daily., Disp: 90 tablet, Rfl: 1   Multiple Vitamins-Minerals (CENTRUM WOMEN PO), Take by mouth., Disp: , Rfl:    omeprazole (PRILOSEC) 20 MG capsule, Take 2 capsules daily (Patient not taking: No sig reported), Disp: 180 capsule, Rfl: 1  Review of Systems:  Constitutional: Denies fever, chills, diaphoresis, appetite change and fatigue.  HEENT: Denies photophobia, eye pain, redness, hearing loss, ear pain, congestion, sore throat, rhinorrhea, sneezing, mouth sores, trouble swallowing, neck pain, neck stiffness and tinnitus.   Respiratory: Denies SOB, DOE, cough, chest tightness,  and wheezing.   Cardiovascular: Denies chest pain, palpitations and leg swelling.   Gastrointestinal: Denies nausea, vomiting, abdominal pain, diarrhea, constipation, blood in stool and abdominal distention.  Genitourinary: Denies dysuria, urgency, frequency, hematuria, flank pain and difficulty urinating.  Endocrine: Denies: hot or cold intolerance, sweats, changes in hair or nails, polyuria, polydipsia. Musculoskeletal: Denies myalgias, back pain and gait problem.  Skin: Denies pallor, rash and wound.  Neurological: Denies dizziness, seizures, syncope, weakness, light-headedness, numbness and headaches.  Hematological: Denies adenopathy. Easy bruising, personal or family bleeding history  Psychiatric/Behavioral: Denies suicidal ideation, mood changes, confusion, nervousness, sleep disturbance and agitation    Physical Exam: Vitals:   01/17/21 1442  BP: 120/80  Pulse: 78  Temp: 98.2 F (36.8 C)  TempSrc: Oral  SpO2: 97%  Weight: 154 lb (69.9 kg)    Body mass index is 28.17 kg/m.   Constitutional: NAD, calm, comfortable Eyes: PERRL, lids and conjunctivae normal, wears corrective lenses ENMT: Mucous membranes are moist.  Musculoskeletal: no clubbing / cyanosis. No joint deformity upper and lower extremities. Good ROM, no contractures. Normal muscle tone.  Skin: no rashes, lesions, ulcers. No induration Neurologic: CN 2-12 grossly intact. Sensation intact, DTR normal. Strength 5/5 in all 4.  Psychiatric: Normal judgment and insight. Alert and oriented x 3. Normal mood.    Impression and Plan:  Right elbow tendinitis -This seems to be the most likely diagnosis given lack of edema and erythema. -Have advised elbow bracing, icing, as needed NSAIDs, if no improvement can consider referral to sports medicine/orthopedics.    Chaya Jan, MD [facility] at [facility]

## 2021-01-23 ENCOUNTER — Encounter: Payer: Self-pay | Admitting: Internal Medicine

## 2021-01-31 ENCOUNTER — Telehealth: Payer: Self-pay | Admitting: Internal Medicine

## 2021-01-31 NOTE — Telephone Encounter (Signed)
FMLA form to be filled out, placed in dr's folder.  Call [phone] upon completion

## 2021-02-01 NOTE — Telephone Encounter (Signed)
Placed in Dr Hernandez's folder 

## 2021-02-02 NOTE — Telephone Encounter (Signed)
Ready for pick up and patient is aware 

## 2021-02-07 ENCOUNTER — Other Ambulatory Visit: Payer: Self-pay | Admitting: Internal Medicine

## 2021-02-23 ENCOUNTER — Encounter: Payer: Self-pay | Admitting: Internal Medicine

## 2021-02-23 DIAGNOSIS — M778 Other enthesopathies, not elsewhere classified: Secondary | ICD-10-CM

## 2021-03-06 ENCOUNTER — Ambulatory Visit: Payer: Self-pay

## 2021-03-06 ENCOUNTER — Ambulatory Visit (INDEPENDENT_AMBULATORY_CARE_PROVIDER_SITE_OTHER): Payer: 59 | Admitting: Orthopaedic Surgery

## 2021-03-06 ENCOUNTER — Other Ambulatory Visit: Payer: Self-pay

## 2021-03-06 DIAGNOSIS — M25521 Pain in right elbow: Secondary | ICD-10-CM | POA: Diagnosis not present

## 2021-03-06 MED ORDER — DICLOFENAC SODIUM 75 MG PO TBEC: 75.0000 mg | DELAYED_RELEASE_TABLET | Freq: Two times a day (BID) | ORAL | 2 refills | Status: DC

## 2021-03-06 NOTE — Progress Notes (Signed)
Office Visit Note   Patient: Sandra Holt           Date of Birth: March 29, 1956           MRN: [id number] Visit Date: 03/06/2021              Requested by: Philip Aspen, Limmie Patricia, MD [address] [place],  [place] [address] PCP: Philip Aspen, Limmie Patricia, MD   Assessment & Plan: Visit Diagnoses:  1. Pain in right elbow     Plan: Impression is right elbow lateral epicondylitis.  I do not feel that her symptoms are severe enough to warrant a cortisone injection.  I recommended a course of outpatient physical therapy with continued bracing and rest.  I have written her out of work for 6 weeks and as her employer did not allow her to do light duty.  Prescription for oral diclofenac.  Follow-up as needed.  Follow-Up Instructions: Return if symptoms worsen or fail to improve.   Orders:  Orders Placed This Encounter  Procedures   XR Elbow Complete Right (3+View)   Ambulatory referral to Physical Therapy   Meds ordered this encounter  Medications   diclofenac (VOLTAREN) 75 MG EC tablet    Sig: Take 1 tablet (75 mg total) by mouth 2 (two) times daily.    Dispense:  30 tablet    Refill:  2      Procedures: No procedures performed   Clinical Data: No additional findings.   Subjective: Chief Complaint  Patient presents with   Right Elbow - Pain    Patient is a very pleasant 65 year old Hispanic female here for 5 months of right elbow pain.  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  She is right-hand dominant.  She has pain when she holds things.  The pain is localized to the posterior lateral region of the elbow.  Denies any numbness and tingling.  PCP prescribed Voltaren gel to use which helps temporarily.  She also uses a counterforce brace during work.  She notices some swelling with the counterforce brace.  Has also used ice.   Review of Systems  Constitutional: Negative.   HENT:  Negative.    Eyes: Negative.   Respiratory: Negative.    Cardiovascular: Negative.   Endocrine: Negative.   Musculoskeletal: Negative.   Neurological: Negative.   Hematological: Negative.   Psychiatric/Behavioral: Negative.    All other systems reviewed and are negative.   Objective: Vital Signs: There were no vitals taken for this visit.  Physical Exam Vitals and nursing note reviewed.  Constitutional:      Appearance: She is well-developed.  Pulmonary:     Effort: Pulmonary effort is normal.  Skin:    General: Skin is warm.     Capillary Refill: Capillary refill takes less than 2 seconds.  Neurological:     Mental Status: She is alert and oriented to person, place, and time.  Psychiatric:        Behavior: Behavior normal.        Thought Content: Thought content normal.        Judgment: Judgment normal.    Ortho Exam Of the right elbow shows normal range of motion.  Negative Tinel at the radial tunnel.  She does not have any pain to resisted ECRB or wrist extension.  She has mainly just tenderness over the lateral epicondyle.  Strength is intact. Specialty Comments:  No specialty comments available.  Imaging: XR Elbow Complete Right (3+View)  Result Date: 03/06/2021 Small osteophyte emanating from the lateral epicondyle.    PMFS History: Patient Active Problem List   Diagnosis Date Noted   Vitamin D deficiency 03/02/2020   Hyperlipidemia 03/02/2020   GERD (gastroesophageal reflux disease)    Past Medical History:  Diagnosis Date   GERD (gastroesophageal reflux disease)     Family History  Problem Relation Age of Onset   Breast cancer Cousin     No past surgical history on file. Social History   Occupational History   Not on file  Tobacco Use   Smoking status: Never   Smokeless tobacco: Never  Substance and Sexual Activity   Alcohol use: Never   Drug use: Never   Sexual activity: Not on file

## 2021-03-12 ENCOUNTER — Other Ambulatory Visit: Payer: Self-pay

## 2021-03-12 ENCOUNTER — Telehealth: Payer: Self-pay | Admitting: Orthopaedic Surgery

## 2021-03-12 ENCOUNTER — Ambulatory Visit: Payer: 59 | Admitting: Rehabilitative and Restorative Service Providers"

## 2021-03-12 ENCOUNTER — Encounter: Payer: Self-pay | Admitting: Rehabilitative and Restorative Service Providers"

## 2021-03-12 DIAGNOSIS — M6281 Muscle weakness (generalized): Secondary | ICD-10-CM

## 2021-03-12 DIAGNOSIS — M25521 Pain in right elbow: Secondary | ICD-10-CM

## 2021-03-12 NOTE — Telephone Encounter (Signed)
[employer] forms received. To [employer] ?

## 2021-03-12 NOTE — Therapy (Signed)
[facility] [address] [place], [place], [address] Phone: [phone]   Fax:  [phone]  Physical Therapy Evaluation  Patient Details  Name: Sandra Holt MRN: [id number] Date of Birth: 1956-04-23 Referring Provider (PT): Tarry Kos, MD   Encounter Date: 03/12/2021   PT End of Session - 03/12/21 1436     Visit Number 1    Number of Visits 16    Date for PT Re-Evaluation 05/07/21    Authorization Type U[employer] $15 copay, 30 PT visit limit    Progress Note Due on Visit 10    PT Start Time 1442    PT Stop Time 1513    PT Time Calculation (min) 31 min    Activity Tolerance Patient tolerated treatment well    Behavior During Therapy [facility] for tasks assessed/performed             Past Medical History:  Diagnosis Date   GERD (gastroesophageal reflux disease)     History reviewed. No pertinent surgical history.  There were no vitals filed for this visit.    Subjective Assessment - 03/12/21 1436     Subjective Referral for complaints of pain in Rt elbow.  Pt. 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  Onset of symptoms in June 2022.  Pt. reported medicine has helped.    Patient Stated Goals Reduce pain    Currently in Pain? Yes    Pain Score 8     Pain Location Elbow    Pain Orientation Right;Lateral    Pain Type Chronic pain    Pain Onset More than a month ago    Pain Frequency Intermittent    Aggravating Factors  grasping, hanging clothes/carrying    Pain Relieving Factors pain medicine, rest                [facility] PT Assessment - 03/12/21 0001       Assessment   Medical Diagnosis Pain in Rt elbow    Referring Provider (PT) Tarry Kos, MD    Onset Date/Surgical Date 10/18/20    Hand Dominance Right      Precautions   Precautions None      Balance Screen   Has the patient fallen in the past 6 months No    Has the patient had a decrease in  activity level because of a fear of falling?  No    Is the patient reluctant to leave their home because of a fear of falling?  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  Overpressure painfree in each direction.    AROM Assessment Site Elbow;Forearm;Wrist    Right/Left Elbow Left;Right    Right/Left Forearm Left;Right    Right/Left Wrist Right      Strength   Overall Strength Comments Power grip Lt 39.1 lbs, 29 lbs.  Rt 29.2 lbs, 20.5 lbs - painfree    Strength Assessment Site Forearm;Wrist;Elbow    Right/Left Elbow Left;Right    Right Elbow Flexion  5/5    Right Elbow Extension 5/5    Left Elbow Flexion 5/5    Left Elbow Extension 5/5    Right/Left Forearm Left;Right    Right Forearm Pronation 5/5    Right Forearm Supination 5/5    Left Forearm Pronation 5/5    Left Forearm Supination 5/5    Right/Left Wrist Left;Right    Right Wrist Flexion 5/5    Right Wrist Extension 4+/5    Right Wrist Radial Deviation 5/5    Left Wrist Flexion 5/5    Left Wrist Extension 5/5    Left Wrist Radial Deviation 5/5    Left Wrist Ulnar Deviation 5/5      Palpation   Palpation comment Tenderness Rt lateral epicondyle, trigger points in proximal wrist extensor group near insertion                        Objective measurements completed on examination: See above findings.       [facility] Adult PT Treatment/Exercise - 03/12/21 0001       Exercises   Exercises Other Exercises    Other Exercises  HEP instruction/performance c cues for techniques, handout provided.  Trial set performed of each for comprehension and symptom assessment.   HEP consisting of wrist extensor stretch, eccentric wrist extensor strengthening c tband.  Additional time spent in review.      Modalities   Modalities Iontophoresis      Iontophoresis   Type of Iontophoresis Dexamethasone    Location Rt lateral epicondyle    Dose 1 mL    Time 4-6 hr at home patch                     PT Education - 03/12/21 1436     Education Details HEP, POC, DN    Person(s) Educated Patient    Methods Explanation;Demonstration;Verbal cues;Handout    Comprehension Returned demonstration;Verbalized understanding              PT Short Term Goals - 03/12/21 1437       PT SHORT TERM GOAL #1   Title Patient will demonstrate independent use of home exercise program to maintain progress from in clinic treatments.    Time 3    Period Weeks    Status New    Target Date 04/02/21               PT Long Term Goals - 03/12/21 1437       PT LONG TERM GOAL #1   Title Patient will demonstrate/report pain at worst less than or equal to 2/10 to facilitate minimal limitation in daily activity secondary to pain symptoms.    Time 8    Period Weeks    Status New    Target Date 05/07/21      PT LONG TERM GOAL #2   Title Patient will demonstrate independent use of home exercise program to facilitate ability to maintain/progress functional gains from skilled physical therapy services.    Time 8    Period Weeks    Status New    Target Date 05/07/21      PT LONG TERM GOAL #3   Title Pt. will demonstrate FOTO outcome > or = 60% to indicated reduced disability due to condition.    Time 8    Period Weeks    Status New    Target Date 05/07/21      PT LONG TERM GOAL #4  Title Pt. will demonstrate Rt elbow strength 5/5 throughout s symptoms, grip strength within 10 % of Lt s symptoms to facilitate usual grasp and work activity.    Time 8    Period Weeks    Status New    Target Date 05/07/21                    Plan - 03/12/21 1438      Clinical Impression Statement Patient is a 65 y.o. who comes to clinic with complaints of Rt elbow pain with mobility, strength and movement coordination deficits that impair their ability to perform usual daily and recreational functional activities without increase difficulty/symptoms at this time.  Patient to benefit from skilled PT services to address impairments and limitations to improve to previous level of function without restriction secondary to condition.    Examination-Activity Limitations Carry;Lift    Examination-Participation Restrictions Cleaning;Community Activity;Occupation;Laundry    Stability/Clinical Decision Making Stable/Uncomplicated    Clinical Decision Making Low    Rehab Potential Good    PT Frequency Other (comment)   1-2x/week   PT Duration 8 weeks    PT Treatment/Interventions ADLs/Self Care Home Management;Electrical Stimulation;Iontophoresis 4mg /ml Dexamethasone;Moist Heat;Balance training;Therapeutic exercise;Therapeutic activities;Functional mobility training;Stair training;Gait training;Ultrasound;DME Instruction;Cognitive remediation;Patient/family education;Neuromuscular re-education;Passive range of motion;Spinal Manipulations;Dry needling;Joint Manipulations;Taping;Manual techniques    PT Next Visit Plan Possible dry needling, manual myofascial release, ionto use again if desired.    PT Home Exercise Plan [email or web address]    Consulted and Agree with Plan of Care Patient             Patient will benefit from skilled therapeutic intervention in order to improve the following deficits and impairments:  Hypomobility, Decreased activity tolerance, Decreased strength, Pain, Impaired UE functional use, Decreased mobility, Decreased range of motion, Impaired perceived functional ability, Improper body mechanics, Impaired flexibility, Decreased coordination  Visit Diagnosis: Pain in right elbow  Muscle weakness (generalized)     Problem List Patient Active  Problem List   Diagnosis Date Noted   Vitamin D deficiency 03/02/2020   Hyperlipidemia 03/02/2020   GERD (gastroesophageal reflux disease)     03/04/2020, PT, DPT, OCS, ATC 03/12/21  3:20 PM    [facility] Physical Therapy [address] [place], [place], [place] Phone: [phone]   Fax:  [phone]  Name: Sandra Holt MRN: Virginia Rochester Date of Birth: 02/15/1956

## 2021-03-12 NOTE — Patient Instructions (Signed)
Access Code: [email or web address] URL: [email or web address] Date: 03/12/2021 Prepared by: Chyrel Masson  Exercises Standing Wrist Flexion Stretch - 3 x daily - 7 x weekly - 1 sets - 5 reps - 15-30 hold Seated Eccentric Wrist Extension (Mirrored) - 2 x daily - 7 x weekly - 3 sets - 10 reps  Patient Education Trigger Point Dry Needling Ionto Patient Instructions

## 2021-03-19 ENCOUNTER — Ambulatory Visit: Payer: 59 | Admitting: Physical Therapy

## 2021-03-19 ENCOUNTER — Other Ambulatory Visit: Payer: Self-pay

## 2021-03-19 DIAGNOSIS — M25521 Pain in right elbow: Secondary | ICD-10-CM | POA: Diagnosis not present

## 2021-03-19 DIAGNOSIS — M6281 Muscle weakness (generalized): Secondary | ICD-10-CM | POA: Diagnosis not present

## 2021-03-19 NOTE — Therapy (Signed)
[facility] Therapy [address] [place], [place], [address] Phone: [phone]   Fax:  [phone]  Physical Therapy Treatment  Patient Details  Name: Alazne Quant MRN: [id number] Date of Birth: 30-Apr-1956 Referring Provider (PT): Tarry Kos, MD   Encounter Date: 03/19/2021   PT End of Session - 03/19/21 1458     Visit Number 2    Number of Visits 16    Date for PT Re-Evaluation 05/07/21    Authorization Type UHC $15 copay, 30 PT visit limit    Progress Note Due on Visit 10    PT Start Time 1435    PT Stop Time 1515    PT Time Calculation (min) 40 min    Activity Tolerance Patient tolerated treatment well    Behavior During Therapy [facility] for tasks assessed/performed             Past Medical History:  Diagnosis Date   GERD (gastroesophageal reflux disease)     No past surgical history on file.  There were no vitals filed for this visit.   Subjective Assessment - 03/19/21 1448     Subjective she relays the pain is about the same, the ionto patch did not help any that she can tell. She agrees to try DN today.    Patient Stated Goals Reduce pain    Pain Onset More than a month ago                [facility] Adult PT Treatment/Exercise - 03/19/21 0001       Exercises   Exercises Elbow      Elbow Exercises   Other elbow exercises gripping red digi grip X20, eccentrics for wrist extension 2# X15, red therabar pronation and supination X 20 ea, red therabar wirst flexion and extension x20ea    Other elbow exercises wrist extensor stretching into wrist flexion 30 sec X3      Manual Therapy   Manual therapy comments skilled palpation with active compression with DN, STM              Trigger Point Dry Needling - 03/19/21 0001     Consent Given? Yes    Education Handout Provided Previously provided    Muscles Treated Wrist/Hand Extensor carpi radialis longus/brevis;Extensor digitorum    Dry Needling Comments  twitch response elicited, good overall tolerance    Electrical Stimulation Performed with Dry Needling Yes   micro current 100 frequency intensity 3                    PT Short Term Goals - 03/12/21 1437       PT SHORT TERM GOAL #1   Title Patient will demonstrate independent use of home exercise program to maintain progress from in clinic treatments.    Time 3    Period Weeks    Status New    Target Date 04/02/21               PT Long Term Goals - 03/12/21 1437       PT LONG TERM GOAL #1   Title Patient will demonstrate/report pain at worst less than or equal to 2/10 to facilitate minimal limitation in daily activity secondary to pain symptoms.    Time 8    Period Weeks    Status New    Target Date 05/07/21      PT LONG TERM GOAL #2   Title Patient will demonstrate independent use of home exercise  program to facilitate ability to maintain/progress functional gains from skilled physical therapy services.    Time 8    Period Weeks    Status New    Target Date 05/07/21      PT LONG TERM GOAL #3   Title Pt. will demonstrate FOTO outcome > or = 60% to indicated reduced disability due to condition.    Time 8    Period Weeks    Status New    Target Date 05/07/21      PT LONG TERM GOAL #4   Title Pt. will demonstrate Rt elbow strength 5/5 throughout s symptoms, grip strength within 10 % of Lt s symptoms to facilitate usual grasp and work activity.    Time 8    Period Weeks    Status New    Target Date 05/07/21                   Plan - 03/19/21 1504     Clinical Impression Statement We held off on Ionto today as she did not feel any benefit from this and instead trialed DN today to wrist extensors with Estim today with good overall tolerance and reports decreased symptoms after this. We then performed light stretching and strengthening with wrist extenser group focus. We will assess her longer term response to DN next visit.     Examination-Activity Limitations Carry;Lift    Examination-Participation Restrictions Cleaning;Community Activity;Occupation;Laundry    Stability/Clinical Decision Making Stable/Uncomplicated    Rehab Potential Good    PT Frequency Other (comment)   1-2x/week   PT Duration 8 weeks    PT Treatment/Interventions ADLs/Self Care Home Management;Electrical Stimulation;Iontophoresis 4mg /ml Dexamethasone;Moist Heat;Balance training;Therapeutic exercise;Therapeutic activities;Functional mobility training;Stair training;Gait training;Ultrasound;DME Instruction;Cognitive remediation;Patient/family education;Neuromuscular re-education;Passive range of motion;Spinal Manipulations;Dry needling;Joint Manipulations;Taping;Manual techniques    PT Next Visit Plan how was DN and continue with this if desired,  manual myofascial release, wrist extensor group focus    PT Home Exercise Plan [email or web address]    Consulted and Agree with Plan of Care Patient             Patient will benefit from skilled therapeutic intervention in order to improve the following deficits and impairments:  Hypomobility, Decreased activity tolerance, Decreased strength, Pain, Impaired UE functional use, Decreased mobility, Decreased range of motion, Impaired perceived functional ability, Improper body mechanics, Impaired flexibility, Decreased coordination  Visit Diagnosis: Pain in right elbow  Muscle weakness (generalized)     Problem List Patient Active Problem List   Diagnosis Date Noted   Vitamin D deficiency 03/02/2020   Hyperlipidemia 03/02/2020   GERD (gastroesophageal reflux disease)     03/04/2020, PT,DPT 03/19/2021, 3:22 PM  [facility] Physical Therapy [address] [place], [place], [place] Phone: [phone]   Fax:  [phone]  Name: Anum Palecek MRN: Virginia Rochester Date of Birth: 09/07/1955

## 2021-03-20 ENCOUNTER — Telehealth: Payer: Self-pay | Admitting: Orthopaedic Surgery

## 2021-03-20 NOTE — Telephone Encounter (Signed)
Received medical records release form $25.00 check. Forwarding to [employer] today ?

## 2021-03-21 ENCOUNTER — Other Ambulatory Visit: Payer: Self-pay

## 2021-03-21 ENCOUNTER — Encounter: Payer: Self-pay | Admitting: Rehabilitative and Restorative Service Providers"

## 2021-03-21 ENCOUNTER — Ambulatory Visit (INDEPENDENT_AMBULATORY_CARE_PROVIDER_SITE_OTHER): Payer: 59 | Admitting: Rehabilitative and Restorative Service Providers"

## 2021-03-21 DIAGNOSIS — M25521 Pain in right elbow: Secondary | ICD-10-CM

## 2021-03-21 DIAGNOSIS — M6281 Muscle weakness (generalized): Secondary | ICD-10-CM | POA: Diagnosis not present

## 2021-03-21 NOTE — Therapy (Signed)
[facility] Physical Therapy [address] [place], [place], [address] Phone: [phone]   Fax:  [phone]  Physical Therapy Treatment  Patient Details  Name: Sandra Holt MRN: [id number] Date of Birth: January 24, 1956 Referring Provider (PT): Tarry Kos, MD   Encounter Date: 03/21/2021   PT End of Session - 03/21/21 1519     Visit Number 3    Number of Visits 16    Date for PT Re-Evaluation 05/07/21    Authorization Type U[employer] $15 copay, 30 PT visit limit    Progress Note Due on Visit 10    PT Start Time 1519    PT Stop Time 1559    PT Time Calculation (min) 40 min    Activity Tolerance Patient tolerated treatment well    Behavior During Therapy [facility] for tasks assessed/performed             Past Medical History:  Diagnosis Date   GERD (gastroesophageal reflux disease)     History reviewed. No pertinent surgical history.  There were no vitals filed for this visit.   Subjective Assessment - 03/21/21 1526     Subjective Pt. indicated improvement for a period of time but then felt it return.    Patient Stated Goals Reduce pain    Currently in Pain? No/denies    Pain Score 3     Pain Location Elbow    Pain Orientation Right;Lateral    Pain Type Chronic pain    Pain Onset More than a month ago    Pain Frequency Intermittent    Aggravating Factors  grasping    Pain Relieving Factors rest                [facility] PT Assessment - 03/21/21 0001       Assessment   Medical Diagnosis Pain in Rt elbow    Referring Provider (PT) Tarry Kos, MD    Onset Date/Surgical Date 10/18/20    Hand Dominance Right      AROM   Overall AROM Comments WFL no symptoms for Rt elbow      Palpation   Palpation comment Very mild tenderness noted Rt lateral epicondyle(much less than initial)                           [facility] Adult PT Treatment/Exercise - 03/21/21 0001       Elbow Exercises   Elbow Extension Right   eccentric 4  lbs 3 x 10   Other elbow exercises supination/pronation 3 lbs Rt arm    Other elbow exercises wrist extensor stretching into wrist flexion 30 sec x 5, Rt green digi grip 2 sec hold x 20      Iontophoresis   Type of Iontophoresis Dexamethasone    Location Rt lateral epicondyle    Dose 1 mL    Time 4-6 hr at home patch      Manual Therapy   Manual therapy comments compression, palpation to Rt wrist extensor group proximally              Trigger Point Dry Needling - 03/21/21 0001     Consent Given? Yes    Education Handout Provided Previously provided    Muscles Treated Wrist/Hand Extensor carpi radialis longus/brevis;Extensor carpi ulnaris   Rt   Extensor carpi radialis longus/brevis Response Twitch response elicited    Extensor carpi ulnaris Response Twitch response elicited  PT Short Term Goals - 03/21/21 1520       PT SHORT TERM GOAL #1   Title Patient will demonstrate independent use of home exercise program to maintain progress from in clinic treatments.    Time 3    Period Weeks    Status On-going    Target Date 04/02/21               PT Long Term Goals - 03/12/21 1437       PT LONG TERM GOAL #1   Title Patient will demonstrate/report pain at worst less than or equal to 2/10 to facilitate minimal limitation in daily activity secondary to pain symptoms.    Time 8    Period Weeks    Status New    Target Date 05/07/21      PT LONG TERM GOAL #2   Title Patient will demonstrate independent use of home exercise program to facilitate ability to maintain/progress functional gains from skilled physical therapy services.    Time 8    Period Weeks    Status New    Target Date 05/07/21      PT LONG TERM GOAL #3   Title Pt. will demonstrate FOTO outcome > or = 60% to indicated reduced disability due to condition.    Time 8    Period Weeks    Status New    Target Date 05/07/21      PT LONG TERM GOAL #4   Title Pt. will  demonstrate Rt elbow strength 5/5 throughout s symptoms, grip strength within 10 % of Lt s symptoms to facilitate usual grasp and work activity.    Time 8    Period Weeks    Status New    Target Date 05/07/21                   Plan - 03/21/21 1535     Clinical Impression Statement Proceeded today with manual and DN treatment to produce twitch response c concordant symptoms from Rt extensor group for continued treatment to help progress symptom relief duration (temporary from last visit).  Repeated use of ionto to provide additional repeat application for time for medication to treat area.  Overall positive reaction to initial treatments noted at this time, though more short term.  Reduced tenderness overall noted in area though.    Examination-Activity Limitations Carry;Lift    Examination-Participation Restrictions Cleaning;Community Activity;Occupation;Laundry    Stability/Clinical Decision Making Stable/Uncomplicated    Rehab Potential Good    PT Frequency Other (comment)   1-2x/week   PT Duration 8 weeks    PT Treatment/Interventions ADLs/Self Care Home Management;Electrical Stimulation;Iontophoresis 4mg /ml Dexamethasone;Moist Heat;Balance training;Therapeutic exercise;Therapeutic activities;Functional mobility training;Stair training;Gait training;Ultrasound;DME Instruction;Cognitive remediation;Patient/family education;Neuromuscular re-education;Passive range of motion;Spinal Manipulations;Dry needling;Joint Manipulations;Taping;Manual techniques    PT Next Visit Plan Ionto 1-2x times possible, DN for twitch response improvements    PT Home Exercise Plan [email or web address]    Consulted and Agree with Plan of Care Patient             Patient will benefit from skilled therapeutic intervention in order to improve the following deficits and impairments:  Hypomobility, Decreased activity tolerance, Decreased strength, Pain, Impaired UE functional use, Decreased mobility, Decreased range  of motion, Impaired perceived functional ability, Improper body mechanics, Impaired flexibility, Decreased coordination  Visit Diagnosis: Pain in right elbow  Muscle weakness (generalized)     Problem List Patient Active Problem List   Diagnosis Date Noted   Vitamin D deficiency  03/02/2020   Hyperlipidemia 03/02/2020   GERD (gastroesophageal reflux disease)     Chyrel Masson, PT, DPT, OCS, ATC 03/21/21  3:51 PM    [facility] Physical Therapy [address] [place], [place], [address] Phone: [phone]   Fax:  [phone]  Name: Aftin Lye MRN: [id number] Date of Birth: 1955-05-22

## 2021-03-26 ENCOUNTER — Encounter: Payer: 59 | Admitting: Rehabilitative and Restorative Service Providers"

## 2021-03-28 ENCOUNTER — Encounter: Payer: Self-pay | Admitting: Rehabilitative and Restorative Service Providers"

## 2021-03-28 ENCOUNTER — Ambulatory Visit: Payer: 59 | Admitting: Rehabilitative and Restorative Service Providers"

## 2021-03-28 ENCOUNTER — Other Ambulatory Visit: Payer: Self-pay

## 2021-03-28 DIAGNOSIS — M6281 Muscle weakness (generalized): Secondary | ICD-10-CM

## 2021-03-28 DIAGNOSIS — M25521 Pain in right elbow: Secondary | ICD-10-CM

## 2021-03-28 NOTE — Patient Instructions (Signed)
Access Code: [email or web address] URL: [email or web address] Date: 03/28/2021 Prepared by: Chyrel Masson  Exercises Standing Wrist Flexion Stretch - 3 x daily - 7 x weekly - 1 sets - 5 reps - 15-30 hold Seated Eccentric Wrist Extension (Mirrored) - 2 x daily - 7 x weekly - 3 sets - 10 reps Standing Shoulder Row with Anchored Resistance - 1 x daily - 7 x weekly - 3 sets - 10 reps Shoulder Extension with Resistance - 1 x daily - 7 x weekly - 3 sets - 10 reps Standing Single Arm Elbow Flexion with Resistance (Mirrored) - 1 x daily - 7 x weekly - 3 sets - 10 reps Standing Elbow Extension with Self-Anchored Resistance - 1 x daily - 7 x weekly - 3 sets - 10 reps

## 2021-03-28 NOTE — Therapy (Signed)
[facility] Physical Therapy [address] [place], [place], [address] Phone: [phone]   Fax:  [phone]  Physical Therapy Treatment  Patient Details  Name: Sandra Holt MRN: [id number] Date of Birth: [birth year] Referring Provider (PT): Tarry Kos, MD   Encounter Date: 03/28/2021   PT End of Session - 03/28/21 1436     Visit Number 4    Number of Visits 16    Date for PT Re-Evaluation 05/07/21    Authorization Type UHC $15 copay, 30 PT visit limit    Progress Note Due on Visit 10    PT Start Time 1436    PT Stop Time 1514    PT Time Calculation (min) 38 min    Activity Tolerance Patient tolerated treatment well    Behavior During Therapy [facility] for tasks assessed/performed             Past Medical History:  Diagnosis Date   GERD (gastroesophageal reflux disease)     History reviewed. No pertinent surgical history.  There were no vitals filed for this visit.   Subjective Assessment - 03/28/21 1437     Subjective Pt. indicated less pain but feels tired.  Pt. indicated no pain upon arrival unless pushing on arm.  She indicated she could now grab her cup of coffee.    Patient Stated Goals Reduce pain    Currently in Pain? No/denies    Pain Score 0-No pain    Pain Onset More than a month ago                [facility] PT Assessment - 03/28/21 0001       Assessment   Medical Diagnosis Pain in Rt elbow    Referring Provider (PT) Tarry Kos, MD    Onset Date/Surgical Date 10/18/20    Hand Dominance Right                           [facility] Adult PT Treatment/Exercise - 03/28/21 0001       Elbow Exercises   Elbow Extension Right   4 lb 3 x 10   Other elbow exercises UBE fwd/back 4 mins each way lvl 3, supination/pronation    Other elbow exercises tband rows, gh ext green 25x each way, elbow extension/flexion green band 25x each Rt      Iontophoresis   Type of Iontophoresis Dexamethasone    Location Rt  lateral epicondyle    Dose 1 mL    Time 4-6 hr at home patch                     PT Education - 03/28/21 1454     Education Details HEP progression    Person(s) Educated Patient    Methods Explanation;Demonstration;Verbal cues;Handout    Comprehension Returned demonstration;Verbalized understanding              PT Short Term Goals - 03/28/21 1445       PT SHORT TERM GOAL #1   Title Patient will demonstrate independent use of home exercise program to maintain progress from in clinic treatments.    Time 3    Period Weeks    Status Achieved    Target Date 04/02/21               PT Long Term Goals - 03/12/21 1437       PT LONG TERM GOAL #1   Title Patient will  demonstrate/report pain at worst less than or equal to 2/10 to facilitate minimal limitation in daily activity secondary to pain symptoms.    Time 8    Period Weeks    Status New    Target Date 05/07/21      PT LONG TERM GOAL #2   Title Patient will demonstrate independent use of home exercise program to facilitate ability to maintain/progress functional gains from skilled physical therapy services.    Time 8    Period Weeks    Status New    Target Date 05/07/21      PT LONG TERM GOAL #3   Title Pt. will demonstrate FOTO outcome > or = 60% to indicated reduced disability due to condition.    Time 8    Period Weeks    Status New    Target Date 05/07/21      PT LONG TERM GOAL #4   Title Pt. will demonstrate Rt elbow strength 5/5 throughout s symptoms, grip strength within 10 % of Lt s symptoms to facilitate usual grasp and work activity.    Time 8    Period Weeks    Status New    Target Date 05/07/21                   Plan - 03/28/21 1445     Clinical Impression Statement Reduction in severity of symptoms noted in last visit or two.  Focus today primarily on strengthening program with continued ionto use.  Plan to recheck for possible dry needling next visit and possible  withholding ionto due to progress.    Examination-Activity Limitations Carry;Lift    Examination-Participation Restrictions Cleaning;Community Activity;Occupation;Laundry    Stability/Clinical Decision Making Stable/Uncomplicated    Rehab Potential Good    PT Frequency Other (comment)   1-2x/week   PT Duration 8 weeks    PT Treatment/Interventions ADLs/Self Care Home Management;Electrical Stimulation;Iontophoresis 4mg /ml Dexamethasone;Moist Heat;Balance training;Therapeutic exercise;Therapeutic activities;Functional mobility training;Stair training;Gait training;Ultrasound;DME Instruction;Cognitive remediation;Patient/family education;Neuromuscular re-education;Passive range of motion;Spinal Manipulations;Dry needling;Joint Manipulations;Taping;Manual techniques    PT Next Visit Plan DN if desired, continued strengthening program for BUE.    PT Home Exercise Plan [email or web address]    Consulted and Agree with Plan of Care Patient             Patient will benefit from skilled therapeutic intervention in order to improve the following deficits and impairments:  Hypomobility, Decreased activity tolerance, Decreased strength, Pain, Impaired UE functional use, Decreased mobility, Decreased range of motion, Impaired perceived functional ability, Improper body mechanics, Impaired flexibility, Decreased coordination  Visit Diagnosis: Pain in right elbow  Muscle weakness (generalized)     Problem List Patient Active Problem List   Diagnosis Date Noted   Vitamin D deficiency 03/02/2020   Hyperlipidemia 03/02/2020   GERD (gastroesophageal reflux disease)     03/04/2020, PT, DPT, OCS, ATC 03/28/21  3:16 PM    [facility] Physical Therapy [address] [place], [place], [place] Phone: [phone]   Fax:  [phone]  Name: Sandra Holt MRN: [place] Date of Birth: 27-Mar-1956

## 2021-04-02 ENCOUNTER — Other Ambulatory Visit: Payer: Self-pay

## 2021-04-02 ENCOUNTER — Encounter: Payer: Self-pay | Admitting: Rehabilitative and Restorative Service Providers"

## 2021-04-02 ENCOUNTER — Ambulatory Visit: Payer: 59 | Admitting: Rehabilitative and Restorative Service Providers"

## 2021-04-02 DIAGNOSIS — M6281 Muscle weakness (generalized): Secondary | ICD-10-CM

## 2021-04-02 DIAGNOSIS — M25521 Pain in right elbow: Secondary | ICD-10-CM | POA: Diagnosis not present

## 2021-04-02 NOTE — Patient Instructions (Signed)
Access Code: [email or web address] URL: [email or web address] Date: 04/02/2021 Prepared by: Chyrel Masson  Exercises Standing Wrist Flexion Stretch - 3 x daily - 7 x weekly - 1 sets - 5 reps - 15-30 hold Seated Eccentric Wrist Extension (Mirrored) - 2 x daily - 7 x weekly - 3 sets - 10 reps Standing Shoulder Row with Anchored Resistance - 1 x daily - 7 x weekly - 3 sets - 10 reps Shoulder Extension with Resistance - 1 x daily - 7 x weekly - 3 sets - 10 reps Standing Single Arm Elbow Flexion with Resistance (Mirrored) - 1 x daily - 7 x weekly - 3 sets - 10 reps Standing Elbow Extension with Self-Anchored Resistance - 1 x daily - 7 x weekly - 3 sets - 10 reps Forearm Pronation and Supination with Hammer - 1 x daily - 7 x weekly - 3 sets - 15 reps

## 2021-04-02 NOTE — Therapy (Addendum)
[facility] Physical Therapy [address] [place], [place], [address] Phone: [phone]   Fax:  [phone]  Physical Therapy Treatment /Discharge  Patient Details  Name: Sandra Holt MRN: [id number] Date of Birth: 1955-07-19 Referring Provider (PT): Tarry Kos, MD   Encounter Date: 04/02/2021   PT End of Session - 04/02/21 1435     Visit Number 5    Number of Visits 16    Date for PT Re-Evaluation 05/07/21    Authorization Type UHC $15 copay, 30 PT visit limit    Progress Note Due on Visit 10    PT Start Time 1435    PT Stop Time 1514    PT Time Calculation (min) 39 min    Activity Tolerance Patient tolerated treatment well    Behavior During Therapy [facility] for tasks assessed/performed             Past Medical History:  Diagnosis Date   GERD (gastroesophageal reflux disease)     History reviewed. No pertinent surgical history.  There were no vitals filed for this visit.   Subjective Assessment - 04/02/21 1438     Subjective Pt. indicated feeling some tingling in elbow at times, swollen some at times but nothing hurting today and didn't report any specific pain from the weekend.    Patient Stated Goals Reduce pain    Currently in Pain? No/denies    Pain Score 0-No pain    Pain Onset More than a month ago                [facility] PT Assessment - 04/02/21 0001       Assessment   Medical Diagnosis Pain in Rt elbow    Referring Provider (PT) Tarry Kos, MD    Onset Date/Surgical Date 10/18/20    Hand Dominance Right      Observation/Other Assessments   Focus on Therapeutic Outcomes (FOTO)  updated: 60%      Strength   Right Wrist Extension 5/5                           [facility] Adult PT Treatment/Exercise - 04/02/21 0001       Exercises   Other Exercises  HEP review verbally with some trials below      Elbow Exercises   Elbow Flexion Right   3 x 10 green band   Elbow Extension Right   3 x 10  green band   Forearm Supination Limitations supination/pronation turns c 16 oz hammer 2 x 10    Other elbow exercises UBE fwd/back 4 mins each way lvl 3    Other elbow exercises tband rows, gh ext green 3 x 15 each, eccentric wrist extensor strengthening 3 x 10 5 lbs                     PT Education - 04/02/21 1454     Education Details supination/pronation inclusion for home    Person(s) Educated Patient    Methods Explanation;Demonstration;Verbal cues    Comprehension Verbalized understanding;Returned demonstration              PT Short Term Goals - 03/28/21 1445       PT SHORT TERM GOAL #1   Title Patient will demonstrate independent use of home exercise program to maintain progress from in clinic treatments.    Time 3    Period Weeks    Status Achieved  Target Date 04/02/21               PT Long Term Goals - 04/02/21 1455       PT LONG TERM GOAL #1   Title Patient will demonstrate/report pain at worst less than or equal to 2/10 to facilitate minimal limitation in daily activity secondary to pain symptoms.    Time 8    Period Weeks    Status On-going    Target Date 05/07/21      PT LONG TERM GOAL #2   Title Patient will demonstrate independent use of home exercise program to facilitate ability to maintain/progress functional gains from skilled physical therapy services.    Time 8    Period Weeks    Status On-going    Target Date 05/07/21      PT LONG TERM GOAL #3   Title Pt. will demonstrate FOTO outcome > or = 60% to indicated reduced disability due to condition.    Time 8    Period Weeks    Status Achieved      PT LONG TERM GOAL #4   Title Pt. will demonstrate Rt elbow strength 5/5 throughout s symptoms, grip strength within 10 % of Lt s symptoms to facilitate usual grasp and work activity.    Time 8    Period Weeks    Status On-going    Target Date 05/07/21                   Plan - 04/02/21 1439     Clinical Impression  Statement Due to overall improvements in symptoms, held ionto use and any dry needling/manual today due to no pain reports.  Continued c plan to improve strength and HEP for transiitoning to HEP as appropriate in future.  FOTO reassessment today was better than evaluation presentation.    Examination-Activity Limitations Carry;Lift    Examination-Participation Restrictions Cleaning;Community Activity;Occupation;Laundry    Stability/Clinical Decision Making Stable/Uncomplicated    Rehab Potential Good    PT Frequency Other (comment)   1-2x/week   PT Duration 8 weeks    PT Treatment/Interventions ADLs/Self Care Home Management;Electrical Stimulation;Iontophoresis 4mg /ml Dexamethasone;Moist Heat;Balance training;Therapeutic exercise;Therapeutic activities;Functional mobility training;Stair training;Gait training;Ultrasound;DME Instruction;Cognitive remediation;Patient/family education;Neuromuscular re-education;Passive range of motion;Spinal Manipulations;Dry needling;Joint Manipulations;Taping;Manual techniques    PT Next Visit Plan DN if desired, continued strengthening program for BUE.    PT Home Exercise Plan [email or web address]    Consulted and Agree with Plan of Care Patient             Patient will benefit from skilled therapeutic intervention in order to improve the following deficits and impairments:  Hypomobility, Decreased activity tolerance, Decreased strength, Pain, Impaired UE functional use, Decreased mobility, Decreased range of motion, Impaired perceived functional ability, Improper body mechanics, Impaired flexibility, Decreased coordination  Visit Diagnosis: Pain in right elbow  Muscle weakness (generalized)     Problem List Patient Active Problem List   Diagnosis Date Noted   Vitamin D deficiency 03/02/2020   Hyperlipidemia 03/02/2020   GERD (gastroesophageal reflux disease)     Scot Jun, PT, DPT, OCS, ATC 04/02/21  3:09 PM  PHYSICAL THERAPY DISCHARGE  SUMMARY  Visits from Start of Care: 5  Current functional level related to goals / functional outcomes: See note   Remaining deficits: See note   Education / Equipment: HEP   Patient agrees to discharge. Patient goals were partially met. Patient is being discharged due to not returning since the last visit.  Scot Jun, PT,  DPT, OCS, ATC 04/30/21  10:14 AM      [facility] Physical Therapy [address] [place], [place], [address] Phone: [phone]   Fax:  [phone]  Name: Sandra Holt MRN: [id number] Date of Birth: [birth year]

## 2021-04-04 ENCOUNTER — Encounter: Payer: 59 | Admitting: Physical Therapy

## 2021-08-22 ENCOUNTER — Ambulatory Visit (INDEPENDENT_AMBULATORY_CARE_PROVIDER_SITE_OTHER): Payer: BC Managed Care – PPO | Admitting: Internal Medicine

## 2021-08-22 ENCOUNTER — Encounter: Payer: Self-pay | Admitting: Internal Medicine

## 2021-08-22 VITALS — BP 120/80 | HR 92 | Temp 98.5°F | Ht 62.0 in | Wt 150.8 lb

## 2021-08-22 DIAGNOSIS — K921 Melena: Secondary | ICD-10-CM | POA: Diagnosis not present

## 2021-08-22 DIAGNOSIS — K219 Gastro-esophageal reflux disease without esophagitis: Secondary | ICD-10-CM | POA: Diagnosis not present

## 2021-08-22 NOTE — Progress Notes (Signed)
? ? ? ?Established Patient Office Visit ? ? ? ? ?This visit occurred during the SARS-CoV-2 public health emergency.  Safety protocols were in place, including screening questions prior to the visit, additional usage of staff PPE, and extensive cleaning of exam room while observing appropriate contact time as indicated for disinfecting solutions.  ? ? ?CC/Reason for Visit: Blood in stool ? ?HPI: Sandra Holt is a 66 y.o. female who is coming in today for the above mentioned reasons. 5 days ago she noted a moderated amount of bright red blood and clots in the toilet and when wiping. This lasted for 3 days. No abdominal pain, nausea, vomiting or diarrhea. Completely resolved as of today. She has h/o GERD and has been taking her PPI only intermittently. She has been c/o of increased gastritis/dyspepsia. Her last c-scope was about 13 years ago in [place] (no records on file), she has never had an EGD. ? ?Past Medical/Surgical History: ?Past Medical History:  ?Diagnosis Date  ? GERD (gastroesophageal reflux disease)   ? ? ?No past surgical history on file. ? ?Social History: ? reports that she has never smoked. She has never used smokeless tobacco. She reports that she does not drink alcohol and does not use drugs. ? ?Allergies: ?No Known Allergies ? ?Family History:  ?Family History  ?Problem Relation Age of Onset  ? Breast cancer Cousin   ? ? ? ?Current Outpatient Medications:  ?  omeprazole (PRILOSEC) 20 MG capsule, Take 2 capsules daily, Disp: 180 capsule, Rfl: 1 ?  atorvastatin (LIPITOR) 20 MG tablet, Take 1 tablet (20 mg total) by mouth daily. (Patient not taking: Reported on 08/22/2021), Disp: 90 tablet, Rfl: 1 ?  diclofenac (VOLTAREN) 75 MG EC tablet, Take 1 tablet (75 mg total) by mouth 2 (two) times daily. (Patient not taking: Reported on 08/22/2021), Disp: 30 tablet, Rfl: 2 ?  Multiple Vitamins-Minerals (CENTRUM WOMEN PO), Take by mouth. (Patient not taking: Reported on 08/22/2021), Disp: , Rfl:  ? ?Review  of Systems:  ?Constitutional: Denies fever, chills, diaphoresis, appetite change and fatigue.  ?HEENT: Denies photophobia, eye pain, redness, hearing loss, ear pain, congestion, sore throat, rhinorrhea, sneezing, mouth sores, trouble swallowing, neck pain, neck stiffness and tinnitus.   ?Respiratory: Denies SOB, DOE, cough, chest tightness,  and wheezing.   ?Cardiovascular: Denies chest pain, palpitations and leg swelling.  ?Gastrointestinal: Denies nausea, vomiting, abdominal pain, diarrhea, constipation and abdominal distention.  ?Genitourinary: Denies dysuria, urgency, frequency, hematuria, flank pain and difficulty urinating.  ?Endocrine: Denies: hot or cold intolerance, sweats, changes in hair or nails, polyuria, polydipsia. ?Musculoskeletal: Denies myalgias, back pain, joint swelling, arthralgias and gait problem.  ?Skin: Denies pallor, rash and wound.  ?Neurological: Denies dizziness, seizures, syncope, weakness, light-headedness, numbness and headaches.  ?Hematological: Denies adenopathy. Easy bruising, personal or family bleeding history  ?Psychiatric/Behavioral: Denies suicidal ideation, mood changes, confusion, nervousness, sleep disturbance and agitation ? ? ? ?Physical Exam: ?Vitals:  ? 08/22/21 1538  ?BP: 120/80  ?Pulse: 92  ?Temp: 98.5 ?F (36.9 ?C)  ?TempSrc: Oral  ?SpO2: 98%  ?Weight: 150 lb 12.8 oz (68.4 kg)  ?Height: 5\' 2"  (1.575 m)  ? ? ?Body mass index is 27.58 kg/m?. ? ? ?Constitutional: NAD, calm, comfortable ?Eyes: PERRL, lids and conjunctivae normal ?ENMT: Mucous membranes are moist. ?Psychiatric: Normal judgment and insight. Alert and oriented x 3. Normal mood.  ? ? ?Impression and Plan: ? ?Hematochezia - Plan: CBC with Differential/Platelet ? ?Gastroesophageal reflux disease without esophagitis ? ?-Had advised she take PPI BID,  check CBC and will place referral to GI. She will notify us if hematochezia recurs. ? ?Time spent:30 minutes reviewing chart, interviewing and examining patient and  formulating plan of care. ? ? ? ? ? ?Chaya Jan, MD ?[facility] ? ? ?

## 2021-08-23 LAB — CBC WITH DIFFERENTIAL/PLATELET
Basophils Absolute: 0 10*3/uL (ref 0.0–0.1)
Basophils Relative: 0.8 % (ref 0.0–3.0)
Eosinophils Absolute: 0 10*3/uL (ref 0.0–0.7)
Eosinophils Relative: 0.7 % (ref 0.0–5.0)
HCT: 39.7 % (ref 36.0–46.0)
Hemoglobin: 13.2 g/dL (ref 12.0–15.0)
Lymphocytes Relative: 28.6 % (ref 12.0–46.0)
Lymphs Abs: 1.5 10*3/uL (ref 0.7–4.0)
MCHC: 33.2 g/dL (ref 30.0–36.0)
MCV: 90.5 fl (ref 78.0–100.0)
Monocytes Absolute: 0.4 10*3/uL (ref 0.1–1.0)
Monocytes Relative: 7.7 % (ref 3.0–12.0)
Neutro Abs: 3.3 10*3/uL (ref 1.4–7.7)
Neutrophils Relative %: 62.2 % (ref 43.0–77.0)
Platelets: 273 10*3/uL (ref 150.0–400.0)
RBC: 4.38 Mil/uL (ref 3.87–5.11)
RDW: 13.2 % (ref 11.5–15.5)
WBC: 5.4 10*3/uL (ref 4.0–10.5)

## 2021-09-05 ENCOUNTER — Other Ambulatory Visit: Payer: Self-pay | Admitting: Internal Medicine

## 2021-09-05 ENCOUNTER — Telehealth: Payer: Self-pay | Admitting: Internal Medicine

## 2021-09-05 DIAGNOSIS — Z1231 Encounter for screening mammogram for malignant neoplasm of breast: Secondary | ICD-10-CM

## 2021-09-05 DIAGNOSIS — Z1211 Encounter for screening for malignant neoplasm of colon: Secondary | ICD-10-CM

## 2021-09-05 NOTE — Telephone Encounter (Signed)
Referral placed.

## 2021-09-05 NOTE — Telephone Encounter (Signed)
Patient called because she would like to know which office will be doing her colonoscopy. She states that she has not gotten a call from their office yet and would like to get it scheduled. ? ? ? ? ? ? ? ?Please advise  ?

## 2021-09-18 ENCOUNTER — Ambulatory Visit
Admission: RE | Admit: 2021-09-18 | Discharge: 2021-09-18 | Disposition: A | Discharge status: Home / Self Care | Payer: Self-pay | Source: Ambulatory Visit | Attending: Internal Medicine | Admitting: Internal Medicine

## 2021-09-18 DIAGNOSIS — Z1231 Encounter for screening mammogram for malignant neoplasm of breast: Secondary | ICD-10-CM | POA: Diagnosis not present

## 2021-11-07 ENCOUNTER — Encounter: Payer: Self-pay | Admitting: Internal Medicine

## 2021-11-08 ENCOUNTER — Encounter: Payer: Self-pay | Admitting: Gastroenterology

## 2021-11-14 ENCOUNTER — Ambulatory Visit (AMBULATORY_SURGERY_CENTER): Payer: Self-pay | Admitting: *Deleted

## 2021-11-14 VITALS — Ht 62.0 in | Wt 149.0 lb

## 2021-11-14 DIAGNOSIS — Z1211 Encounter for screening for malignant neoplasm of colon: Secondary | ICD-10-CM

## 2021-11-14 MED ORDER — NA SULFATE-K SULFATE-MG SULF 17.5-3.13-1.6 GM/177ML PO SOLN: 1.0000 | ORAL | 0 refills | Status: DC

## 2021-11-14 NOTE — Progress Notes (Signed)
Patient is here in-person for PV. Patient did not want an interpreter for this PV, but she agreed to use one for the colonoscopy.  Patient denies any allergies to eggs or soy. Patient denies any problems with anesthesia/sedation. Patient is not on any oxygen at home. Patient is not taking any diet/weight loss medications or blood thinners. Went over procedure prep instructions with the patient. Patient is aware of our care-partner policy. Patient notified to use Singlecare card given for suprep RX. EMMI education assigned to the patient for the procedure, sent to MyChart.  Patient states she has BM everyday, takes fiber couple times a week. Patient saw blood in stool x2 went to see PCP-has not seen any since then per pt.

## 2021-12-03 ENCOUNTER — Encounter: Payer: Self-pay | Admitting: Gastroenterology

## 2021-12-04 ENCOUNTER — Encounter: Payer: Self-pay | Admitting: Gastroenterology

## 2021-12-04 ENCOUNTER — Ambulatory Visit (AMBULATORY_SURGERY_CENTER): Payer: BC Managed Care – PPO | Admitting: Gastroenterology

## 2021-12-04 VITALS — BP 149/84 | HR 73 | Temp 97.8°F | Resp 11 | Ht 62.0 in | Wt 149.0 lb

## 2021-12-04 DIAGNOSIS — Z1211 Encounter for screening for malignant neoplasm of colon: Secondary | ICD-10-CM

## 2021-12-04 MED ORDER — SODIUM CHLORIDE 0.9 % IV SOLN: 500.0000 mL | Freq: Once | INTRAVENOUS | Status: DC

## 2021-12-04 NOTE — Op Note (Signed)
[facility] Patient Name: Sandra Holt Procedure Date: 12/04/2021 1:35 PM MRN: [id number] Endoscopist: Rachael Fee , MD Age: 66 Referring MD:  Date of Birth: May 21, 1955 Gender: Female Account #: [id number] Procedure:                Colonoscopy Indications:              Screening for colorectal malignant neoplasm Medicines:                Monitored Anesthesia Care Procedure:                Pre-Anesthesia Assessment:                           - Prior to the procedure, a History and Physical                            was performed, and patient medications and                            allergies were reviewed. The patient's tolerance of                            previous anesthesia was also reviewed. The risks                            and benefits of the procedure and the sedation                            options and risks were discussed with the patient.                            All questions were answered, and informed consent                            was obtained. Prior Anticoagulants: The patient has                            taken no previous anticoagulant or antiplatelet                            agents. ASA Grade Assessment: II - A patient with                            mild systemic disease. After reviewing the risks                            and benefits, the patient was deemed in                            satisfactory condition to undergo the procedure.                           After obtaining informed consent, the colonoscope  was passed under direct vision. Throughout the                            procedure, the patient's blood pressure, pulse, and                            oxygen saturations were monitored continuously. The                            Olympus CF-HQ190L ([id number]) Colonoscope was                            introduced through the anus and advanced to the the                            cecum,  identified by appendiceal orifice and                            ileocecal valve. The colonoscopy was performed                            without difficulty. The patient tolerated the                            procedure well. The quality of the bowel                            preparation was good. The ileocecal valve,                            appendiceal orifice, and rectum were photographed. Scope In: 1:40:03 PM Scope Out: 1:53:12 PM Scope Withdrawal Time: 0 hours 11 minutes 1 second  Total Procedure Duration: 0 hours 13 minutes 9 seconds  Findings:                 The entire examined colon appeared normal on direct                            and retroflexion views. Complications:            No immediate complications. Estimated blood loss:                            None. Estimated Blood Loss:     Estimated blood loss: none. Impression:               - The entire examined colon is normal on direct and                            retroflexion views.                           - No polyps or cancers. Recommendation:           - Patient has a contact number available for  emergencies. The signs and symptoms of potential                            delayed complications were discussed with the                            patient. Return to normal activities tomorrow.                            Written discharge instructions were provided to the                            patient.                           - Resume previous diet.                           - Continue present medications.                           - Repeat colonoscopy in 10 years for screening                            purposes. Rachael Fee, MD 12/04/2021 1:54:43 PM This report has been signed electronically.

## 2021-12-04 NOTE — Progress Notes (Signed)
HPI: This is a woman at routine risk for CRC   ROS: complete GI ROS as described in HPI, all other review negative.  Constitutional:  No unintentional weight loss   Past Medical History:  Diagnosis Date   GERD (gastroesophageal reflux disease)     Past Surgical History:  Procedure Laterality Date   CESAREAN SECTION     x2   COLONOSCOPY  2012   in [place]-normal exam per pt    Current Outpatient Medications  Medication Sig Dispense Refill   Multiple Vitamins-Minerals (CENTRUM WOMEN PO) Take by mouth.     omeprazole (PRILOSEC) 20 MG capsule Take 2 capsules daily 180 capsule 1   Current Facility-Administered Medications  Medication Dose Route Frequency Provider Last Rate Last Admin   0.9 %  sodium chloride infusion  500 mL Intravenous Once Rachael Fee, MD        Allergies as of 12/04/2021   (No Known Allergies)    Family History  Problem Relation Age of Onset   Cancer Mother        skin   Lung cancer Sister    Breast cancer Cousin    Colon cancer Neg Hx     Social History   Socioeconomic History   Marital status: Married    Spouse name: Not on file   Number of children: Not on file   Years of education: Not on file   Highest education level: Not on file  Occupational History   Not on file  Tobacco Use   Smoking status: Never   Smokeless tobacco: Never  Vaping Use   Vaping Use: Never used  Substance and Sexual Activity   Alcohol use: Yes    Alcohol/week: 1.0 standard drink of alcohol    Types: 1 Glasses of wine per week   Drug use: Never   Sexual activity: Not on file  Other Topics Concern   Not on file  Social History Narrative   Not on file   Social Determinants of Health   Financial Resource Strain: Not on file  Food Insecurity: Not on file  Transportation Needs: Not on file  Physical Activity: Not on file  Stress: Not on file  Social Connections: Not on file  Intimate Partner Violence: Not on file     Physical Exam: BP (!) 138/93    Pulse 84   Temp 97.8 F (36.6 C)   Ht 5\' 2"  (1.575 m)   Wt 149 lb (67.6 kg)   SpO2 96%   BMI 27.25 kg/m  Constitutional: generally well-appearing Psychiatric: alert and oriented x3 Lungs: CTA bilaterally Heart: no MCR  Assessment and plan: 66 y.o. female with routine risk for CRC  Screening colonsocopy today  Care is appropriate for the ambulatory setting.  78, MD [facility] 12/04/2021, 1:34 PM

## 2021-12-04 NOTE — Progress Notes (Signed)
Report to PACU, RN, vss, BBS= Clear.  

## 2021-12-04 NOTE — Patient Instructions (Signed)
Resume previous diet and continue present medications. Repeat colonoscopy in 10 years for surveillance!  USTED TUVO UN PROCEDIMIENTO ENDOSCPICO HOY EN EL [facility]:   Lea el informe del procedimiento que se le entreg para cualquier pregunta especfica sobre lo que se Dentist.  Si el informe del examen no responde a sus preguntas, por favor llame a su gastroenterlogo para aclararlo.  Si usted solicit que no se le den [employer] de lo que se Clinical cytogeneticist en su procedimiento al [employer] va a cuidar, entonces el informe del procedimiento se ha incluido en un sobre sellado para que usted lo revise despus cuando le sea ms conveniente.   LO QUE PUEDE ESPERAR: Algunas sensaciones de hinchazn en el abdomen.  Puede tener ms gases de lo normal.  El caminar puede ayudarle a eliminar el aire que se le puso en el tracto gastrointestinal durante el procedimiento y reducir la hinchazn.  Si le hicieron una endoscopia inferior (como una colonoscopia o una sigmoidoscopia flexible), podra notar manchas de sangre en las heces fecales o en el papel higinico.  Si se someti a una preparacin intestinal para su procedimiento, es posible que no tenga una evacuacin intestinal normal durante [employer].   Tenga en cuenta:  Es posible que note un poco de irritacin y congestin en la nariz o algn drenaje.  Esto es debido al oxgeno [employer] durante su procedimiento.  No hay que preocuparse y esto debe desaparecer ms o Regulatory affairs officer.   SNTOMAS PARA REPORTAR INMEDIATAMENTE:  Despus de una endoscopia inferior (colonoscopia o sigmoidoscopia flexible):  Cantidades excesivas de sangre en las heces fecales  Sensibilidad significativa o empeoramiento de los dolores abdominales   Hinchazn aguda del abdomen que antes no tena   Fiebre de 100F o ms   Despus de la endoscopia superior (EGD)  Vmitos de Retail buyer o material como caf molido   Dolor en el pecho o dolor debajo de  los omplatos que antes no tena   Dolor o dificultad persistente para tragar  Falta de aire que antes no tena   Fiebre de 100F o ms  Heces fecales negras y pegajosas   Para asuntos urgentes o de Associate Professor, puede comunicarse con un gastroenterlogo a cualquier hora llamando al [phone].  DIETA:  Recomendamos una comida pequea al principio, pero luego puede continuar con su dieta normal.  Tome muchos lquidos, Tax adviser las bebidas alcohlicas durante 24 horas.    ACTIVIDAD:  Debe planear tomarse las cosas con calma por el resto del da y no debe CONDUCIR ni usar maquinaria pesada Patent examiner (debido a los medicamentos de sedacin utilizados durante el examen).     SEGUIMIENTO: Nuestro personal llamar al nmero que aparece en su historial al siguiente da hbil de su procedimiento para ver cmo se siente y para responder cualquier pregunta o inquietud que pueda tener con respecto a la informacin que se le dio despus del procedimiento. Si no podemos contactarle, le dejaremos un mensaje.  Sin embargo, si se siente bien y no tiene English as a second language teacher, no es necesario que nos devuelva la llamada.  Asumiremos que ha regresado a sus actividades diarias normales sin incidentes. Si se le tomaron algunas biopsias, le contactaremos por telfono o por carta en las prximas 3 semanas.  Si no ha sabido [employer] biopsias en el transcurso de 3 semanas, por favor llmenos al [phone].   FIRMAS/CONFIDENCIALIDAD: Usted y/o el acompaante que le cuide han Martin  documentos que se ingresarn en su historial mdico electrnico.  Estas firmas atestiguan el hecho de que la informacin anterior   YOU HAD AN ENDOSCOPIC PROCEDURE TODAY AT THE [facility]:   Refer to the procedure report that was given to you for any specific questions about what was found during the examination.  If the procedure report does not answer your questions, please call your gastroenterologist to  clarify.  If you requested that your care partner not be given the details of your procedure findings, then the procedure report has been included in a sealed envelope for you to review at your convenience later.  YOU SHOULD EXPECT: Some feelings of bloating in the abdomen. Passage of more gas than usual.  Walking can help get rid of the air that was put into your GI tract during the procedure and reduce the bloating. If you had a lower endoscopy (such as a colonoscopy or flexible sigmoidoscopy) you may notice spotting of blood in your stool or on the toilet paper. If you underwent a bowel prep for your procedure, you may not have a normal bowel movement for a few days.  Please Note:  You might notice some irritation and congestion in your nose or some drainage.  This is from the oxygen used during your procedure.  There is no need for concern and it should clear up in a day or so.  SYMPTOMS TO REPORT IMMEDIATELY:  Following lower endoscopy (colonoscopy or flexible sigmoidoscopy):  Excessive amounts of blood in the stool  Significant tenderness or worsening of abdominal pains  Swelling of the abdomen that is new, acute  Fever of 100F or higher  For urgent or emergent issues, a gastroenterologist can be reached at any hour by calling [phone]. Do not use MyChart messaging for urgent concerns.    DIET:  We do recommend a small meal at first, but then you may proceed to your regular diet.  Drink plenty of fluids but you should avoid alcoholic beverages for 24 hours.  ACTIVITY:  You should plan to take it easy for the rest of today and you should NOT DRIVE or use heavy machinery until tomorrow (because of the sedation medicines used during the test).    FOLLOW UP: Our staff will call the number listed on your records the next business day following your procedure.  We will call around 7:15- 8:00 am to check on you and address any questions or concerns that you may have regarding the  information given to you following your procedure. If we do not reach you, we will leave a message.  If you develop any symptoms (ie: fever, flu-like symptoms, shortness of breath, cough etc.) before then, please call [phone].  If you test positive for Covid 19 in the 2 weeks post procedure, please call and report this information to [place].    If any biopsies were taken you will be contacted by phone or by letter within the next 1-3 weeks.  Please call us at [phone] if you have not heard about the biopsies in 3 weeks.    SIGNATURES/CONFIDENTIALITY: You and/or your care partner have signed paperwork which will be entered into your electronic medical record.  These signatures attest to the fact that that the information above on your After Visit Summary has been reviewed and is understood.  Full responsibility of the confidentiality of this discharge information lies with you and/or your care-partner.

## 2021-12-05 ENCOUNTER — Telehealth: Payer: Self-pay

## 2021-12-05 NOTE — Telephone Encounter (Signed)
Left message on follow up call. 

## 2022-02-04 ENCOUNTER — Telehealth: Payer: Self-pay | Admitting: Internal Medicine

## 2022-02-04 MED ORDER — OMEPRAZOLE 20 MG PO CPDR: DELAYED_RELEASE_CAPSULE | ORAL | 1 refills | Status: DC

## 2022-02-04 NOTE — Telephone Encounter (Signed)
Last OV for acute reasons 08/22/21 Last CPE 03/01/20  Pt notified of above; appt scheduled for Oct 9. Will refer to PCP if new script is ok.

## 2022-02-04 NOTE — Telephone Encounter (Signed)
Pt is calling and would like new rx omeprazole (PRILOSEC) 20 MG capsule. Pt was getting medication OTC. [facility] [place], [place] Phone:  [phone]  Fax:  [phone]

## 2022-02-04 NOTE — Addendum Note (Signed)
Addended by: Elza Rafter D on: 02/04/2022 05:01 PM   Modules accepted: Orders

## 2022-02-25 ENCOUNTER — Ambulatory Visit (INDEPENDENT_AMBULATORY_CARE_PROVIDER_SITE_OTHER): Payer: Medicare HMO | Admitting: Internal Medicine

## 2022-02-25 VITALS — BP 118/80 | HR 88 | Temp 98.8°F | Ht 61.0 in | Wt 152.9 lb

## 2022-02-25 DIAGNOSIS — E559 Vitamin D deficiency, unspecified: Secondary | ICD-10-CM | POA: Diagnosis not present

## 2022-02-25 DIAGNOSIS — Z0000 Encounter for general adult medical examination without abnormal findings: Secondary | ICD-10-CM

## 2022-02-25 DIAGNOSIS — K219 Gastro-esophageal reflux disease without esophagitis: Secondary | ICD-10-CM

## 2022-02-25 DIAGNOSIS — Z23 Encounter for immunization: Secondary | ICD-10-CM

## 2022-02-25 DIAGNOSIS — E782 Mixed hyperlipidemia: Secondary | ICD-10-CM | POA: Diagnosis not present

## 2022-02-25 LAB — COMPREHENSIVE METABOLIC PANEL
ALT: 36 U/L — ABNORMAL HIGH (ref 0–35)
AST: 20 U/L (ref 0–37)
Albumin: 4.3 g/dL (ref 3.5–5.2)
Alkaline Phosphatase: 74 U/L (ref 39–117)
BUN: 17 mg/dL (ref 6–23)
CO2: 28 mEq/L (ref 19–32)
Calcium: 9.8 mg/dL (ref 8.4–10.5)
Chloride: 103 mEq/L (ref 96–112)
Creatinine, Ser: 0.69 mg/dL (ref 0.40–1.20)
GFR: 91.21 mL/min (ref >60.00)
Glucose, Bld: 75 mg/dL (ref 70–99)
Potassium: 3.6 mEq/L (ref 3.5–5.1)
Sodium: 140 mEq/L (ref 135–145)
Total Bilirubin: 0.7 mg/dL (ref 0.2–1.2)
Total Protein: 7.4 g/dL (ref 6.0–8.3)

## 2022-02-25 LAB — CBC WITH DIFFERENTIAL/PLATELET
Basophils Absolute: 0 10*3/uL (ref 0.0–0.1)
Basophils Relative: 0.6 % (ref 0.0–3.0)
Eosinophils Absolute: 0 10*3/uL (ref 0.0–0.7)
Eosinophils Relative: 0.4 % (ref 0.0–5.0)
HCT: 40.5 % (ref 36.0–46.0)
Hemoglobin: 13.4 g/dL (ref 12.0–15.0)
Lymphocytes Relative: 28 % (ref 12.0–46.0)
Lymphs Abs: 1.5 10*3/uL (ref 0.7–4.0)
MCHC: 33.2 g/dL (ref 30.0–36.0)
MCV: 90.7 fl (ref 78.0–100.0)
Monocytes Absolute: 0.4 10*3/uL (ref 0.1–1.0)
Monocytes Relative: 8 % (ref 3.0–12.0)
Neutro Abs: 3.4 10*3/uL (ref 1.4–7.7)
Neutrophils Relative %: 63 % (ref 43.0–77.0)
Platelets: 317 10*3/uL (ref 150.0–400.0)
RBC: 4.46 Mil/uL (ref 3.87–5.11)
RDW: 13.5 % (ref 11.5–15.5)
WBC: 5.4 10*3/uL (ref 4.0–10.5)

## 2022-02-25 LAB — LIPID PANEL
Cholesterol: 221 mg/dL — ABNORMAL HIGH (ref 0–200)
HDL: 54.7 mg/dL (ref >39.00)
LDL Cholesterol: 133 mg/dL — ABNORMAL HIGH (ref 0–99)
NonHDL: 166.52
Total CHOL/HDL Ratio: 4
Triglycerides: 169 mg/dL — ABNORMAL HIGH (ref 0.0–149.0)
VLDL: 33.8 mg/dL (ref 0.0–40.0)

## 2022-02-25 LAB — VITAMIN D 25 HYDROXY (VIT D DEFICIENCY, FRACTURES): VITD: 28.93 ng/mL — ABNORMAL LOW (ref 30.00–100.00)

## 2022-02-25 NOTE — Progress Notes (Signed)
Established Patient Office Visit     CC/Reason for Visit: Welcome to Medicare preventive visit  HPI: Sandra Holt is a 66 y.o. female who is coming in today for the above mentioned reasons. Past Medical History is significant for: GERD with occasional PPI use, vitamin D deficiency and hyperlipidemia not on medication.  She feels well today and has no acute concerns or complaints.  She has routine eye and dental care, no significant hearing difficulties.   Past Medical/Surgical History: Past Medical History:  Diagnosis Date   GERD (gastroesophageal reflux disease)     Past Surgical History:  Procedure Laterality Date   CESAREAN SECTION     x2   COLONOSCOPY  2012   in [place]-normal exam per pt    Social History:  reports that she has never smoked. She has never used smokeless tobacco. She reports current alcohol use of about 1.0 standard drink of alcohol per week. She reports that she does not use drugs.  Allergies: No Known Allergies  Family History:  Family History  Problem Relation Age of Onset   Cancer Mother        skin   Lung cancer Sister    Breast cancer Cousin    Colon cancer Neg Hx      Current Outpatient Medications:    Multiple Vitamins-Minerals (CENTRUM WOMEN PO), Take by mouth., Disp: , Rfl:    omeprazole (PRILOSEC) 20 MG capsule, Take 2 capsules daily, Disp: 180 capsule, Rfl: 1  Review of Systems:  Constitutional: Denies fever, chills, diaphoresis, appetite change and fatigue.  HEENT: Denies photophobia, eye pain, redness, hearing loss, ear pain, congestion, sore throat, rhinorrhea, sneezing, mouth sores, trouble swallowing, neck pain, neck stiffness and tinnitus.   Respiratory: Denies SOB, DOE, cough, chest tightness,  and wheezing.   Cardiovascular: Denies chest pain, palpitations and leg swelling.  Gastrointestinal: Denies nausea, vomiting, abdominal pain, diarrhea, constipation, blood in stool and abdominal distention.  Genitourinary:  Denies dysuria, urgency, frequency, hematuria, flank pain and difficulty urinating.  Endocrine: Denies: hot or cold intolerance, sweats, changes in hair or nails, polyuria, polydipsia. Musculoskeletal: Denies myalgias, back pain, joint swelling, arthralgias and gait problem.  Skin: Denies pallor, rash and wound.  Neurological: Denies dizziness, seizures, syncope, weakness, light-headedness, numbness and headaches.  Hematological: Denies adenopathy. Easy bruising, personal or family bleeding history  Psychiatric/Behavioral: Denies suicidal ideation, mood changes, confusion, nervousness, sleep disturbance and agitation    Physical Exam: Vitals:   02/25/22 1352  BP: 118/80  Pulse: 88  Temp: 98.8 F (37.1 C)  TempSrc: Oral  SpO2: 96%  Weight: 152 lb 14.4 oz (69.4 kg)  Height: 5\' 1"  (1.549 m)    Body mass index is 28.89 kg/m.   Constitutional: NAD, calm, comfortable Eyes: PERRL, lids and conjunctivae normal, wears corrective lenses ENMT: Mucous membranes are moist. Posterior pharynx clear of any exudate or lesions. Normal dentition. Tympanic membrane is pearly white, no erythema or bulging. Neck: normal, supple, no masses, no thyromegaly Respiratory: clear to auscultation bilaterally, no wheezing, no crackles. Normal respiratory effort. No accessory muscle use.  Cardiovascular: Regular rate and rhythm, no murmurs / rubs / gallops. No extremity edema. 2+ pedal pulses. No carotid bruits.  Abdomen: no tenderness, no masses palpated. No hepatosplenomegaly. Bowel sounds positive.  Musculoskeletal: no clubbing / cyanosis. No joint deformity upper and lower extremities. Good ROM, no contractures. Normal muscle tone.  Skin: no rashes, lesions, ulcers. No induration Neurologic: CN 2-12 grossly intact. Sensation intact, DTR normal. Strength 5/5  in all 4.  Psychiatric: Normal judgment and insight. Alert and oriented x 3. Normal mood.    Initial Medicare wellness visit   1. Risk factors,  based on past  M,S,F -cardiovascular disease risk factors include hyperlipidemia only   2.  Physical activities: With activities of daily living and walking   3.  Depression/mood: Well, not depressed   4.  Hearing: No significant issues   5.  ADL's: Independent in all ADLs   6.  Fall risk: Low fall risk   7.  Home safety: No problems identified   8.  Height weight, and visual acuity: height and weight as above, vision:  Vision Screening   Right eye Left eye Both eyes  Without correction     With correction 20/20 20/20 20/20-1     9.  Counseling: Advised to update all age-appropriate immunizations   10. Lab orders based on risk factors: Laboratory update will be reviewed   11. Referral : None today   12. Care plan: Follow-up with me in 1 year   13. Cognitive assessment: No cognitive impairment   14. Screening: Patient provided with a written and personalized 5-10 year screening schedule in the AVS. yes   15. Provider List Update: PCP only  16. Advance Directives: Full code   17. Opioids: Patient is not on any opioid prescriptions and has no risk factors for a substance use disorder.   [place] Office Visit from 02/25/2022 in [place] at [facility]  PHQ-9 Total Score 2          08/22/2021    3:44 PM 02/25/2022    1:55 PM  [place] in the past year? 0 0  Was there an injury with Fall? 0 0  Fall Risk Category Calculator 0 0  Fall Risk Category Low Low  Patient Fall Risk Level Low fall risk Low fall risk  Patient at Risk for Falls Due to No Fall Risks No Fall Risks  Fall risk Follow up Falls evaluation completed Falls evaluation completed     Impression and Plan:  Welcome to Medicare preventive visit  Vitamin D deficiency - Plan: VITAMIN D 25 Hydroxy (Vit-D Deficiency, Fractures)  Mixed hyperlipidemia - Plan: CBC with Differential/Platelet, Comprehensive metabolic panel, Lipid panel  Gastroesophageal reflux disease without  esophagitis  Need for vaccination against Streptococcus pneumoniae    -Recommend routine eye and dental care. -Immunizations: PCV 20 in office today, she declines flu, COVID, shingles today despite counseling but agrees to update at a later time -Healthy lifestyle discussed in detail. -Labs to be updated today. -Colon cancer screening: 11/2021 -Breast cancer screening: 09/2021 -Cervical cancer screening: 02/2020 -Lung cancer screening: Not applicable -Prostate cancer screening: Not applicable -DEXA: Not applicable    Leith Hedlund Isaac Bliss, MD [facility]

## 2022-02-25 NOTE — Addendum Note (Signed)
Addended byEncarnacion Slates on: 02/25/2022 03:18 PM   Modules accepted: Orders

## 2022-02-26 ENCOUNTER — Other Ambulatory Visit: Payer: Self-pay | Admitting: Internal Medicine

## 2022-02-26 ENCOUNTER — Other Ambulatory Visit: Payer: Self-pay

## 2022-02-26 DIAGNOSIS — E559 Vitamin D deficiency, unspecified: Secondary | ICD-10-CM

## 2022-02-26 DIAGNOSIS — E782 Mixed hyperlipidemia: Secondary | ICD-10-CM

## 2022-02-26 MED ORDER — VITAMIN D (ERGOCALCIFEROL) 1.25 MG (50000 UNIT) PO CAPS: 50000.0000 [IU] | ORAL_CAPSULE | ORAL | 0 refills | Status: AC

## 2022-03-27 DIAGNOSIS — H02831 Dermatochalasis of right upper eyelid: Secondary | ICD-10-CM | POA: Diagnosis not present

## 2022-03-27 DIAGNOSIS — H524 Presbyopia: Secondary | ICD-10-CM | POA: Diagnosis not present

## 2022-03-27 DIAGNOSIS — H2513 Age-related nuclear cataract, bilateral: Secondary | ICD-10-CM | POA: Diagnosis not present

## 2022-03-27 DIAGNOSIS — H5203 Hypermetropia, bilateral: Secondary | ICD-10-CM | POA: Diagnosis not present

## 2022-05-07 ENCOUNTER — Other Ambulatory Visit: Payer: Self-pay | Admitting: Internal Medicine

## 2022-05-07 DIAGNOSIS — E559 Vitamin D deficiency, unspecified: Secondary | ICD-10-CM

## 2022-08-03 ENCOUNTER — Other Ambulatory Visit: Payer: Self-pay | Admitting: Internal Medicine

## 2022-08-21 ENCOUNTER — Other Ambulatory Visit: Payer: Self-pay | Admitting: Internal Medicine

## 2022-08-21 DIAGNOSIS — Z1231 Encounter for screening mammogram for malignant neoplasm of breast: Secondary | ICD-10-CM

## 2022-08-28 ENCOUNTER — Other Ambulatory Visit (INDEPENDENT_AMBULATORY_CARE_PROVIDER_SITE_OTHER): Payer: Medicare HMO

## 2022-08-28 DIAGNOSIS — E782 Mixed hyperlipidemia: Secondary | ICD-10-CM

## 2022-08-28 LAB — LIPID PANEL
Cholesterol: 240 mg/dL — ABNORMAL HIGH (ref 0–200)
HDL: 58 mg/dL (ref >39.00)
NonHDL: 181.65
Total CHOL/HDL Ratio: 4
Triglycerides: 205 mg/dL — ABNORMAL HIGH (ref 0.0–149.0)
VLDL: 41 mg/dL — ABNORMAL HIGH (ref 0.0–40.0)

## 2022-08-28 LAB — LDL CHOLESTEROL, DIRECT: Direct LDL: 135 mg/dL

## 2022-08-30 ENCOUNTER — Telehealth: Payer: Self-pay | Admitting: Internal Medicine

## 2022-08-30 NOTE — Telephone Encounter (Signed)
Pt call and stated she is returning your call and want a call back. 

## 2022-09-02 NOTE — Telephone Encounter (Signed)
Left message on machine for patient to return our call concerning her lab results.

## 2022-09-03 ENCOUNTER — Other Ambulatory Visit: Payer: Self-pay | Admitting: *Deleted

## 2022-09-03 MED ORDER — ATORVASTATIN CALCIUM 20 MG PO TABS: 20.0000 mg | ORAL_TABLET | Freq: Every day | ORAL | 1 refills | Status: DC

## 2022-09-04 ENCOUNTER — Ambulatory Visit: Payer: Medicare HMO | Admitting: Internal Medicine

## 2022-09-12 ENCOUNTER — Encounter: Payer: Self-pay | Admitting: Internal Medicine

## 2022-09-12 ENCOUNTER — Ambulatory Visit (INDEPENDENT_AMBULATORY_CARE_PROVIDER_SITE_OTHER): Payer: Medicare HMO | Admitting: Internal Medicine

## 2022-09-12 VITALS — BP 124/84 | HR 100 | Temp 98.4°F | Wt 154.7 lb

## 2022-09-12 DIAGNOSIS — K219 Gastro-esophageal reflux disease without esophagitis: Secondary | ICD-10-CM

## 2022-09-12 DIAGNOSIS — E782 Mixed hyperlipidemia: Secondary | ICD-10-CM | POA: Diagnosis not present

## 2022-09-12 DIAGNOSIS — R413 Other amnesia: Secondary | ICD-10-CM

## 2022-09-12 DIAGNOSIS — E559 Vitamin D deficiency, unspecified: Secondary | ICD-10-CM

## 2022-09-12 LAB — TSH: TSH: 0.9 u[IU]/mL (ref 0.35–5.50)

## 2022-09-12 LAB — VITAMIN B12: Vitamin B-12: 424 pg/mL (ref 211–911)

## 2022-09-12 NOTE — Assessment & Plan Note (Signed)
Started on atorvastatin 20 mg daily. Recheck lipids in 3 months.

## 2022-09-12 NOTE — Progress Notes (Signed)
Established Patient Office Visit     CC/Reason for Visit: Follow-up labs and discuss acute concern  HPI: Sandra Holt is a 67 y.o. female who is coming in today for the above mentioned reasons. Past Medical History is significant for: Hyperlipidemia, vitamin D deficiency and GERD.  Restarted on atorvastatin 20 mg daily.  She wanted me to explain her cholesterol panel in detail and why she needs the atorvastatin.  She is also concerned about memory loss.  She works as a Conservation officer, nature for [employer].  Sometimes she will have trouble completing tasks that she has always been able to do.  Breaking up her money and giving people change is causing some issues.  Sometimes she will find that she drifts off while customers are talking to her.   Past Medical/Surgical History: Past Medical History:  Diagnosis Date   GERD (gastroesophageal reflux disease)     Past Surgical History:  Procedure Laterality Date   CESAREAN SECTION     x2   COLONOSCOPY  2012   in [place]-normal exam per pt    Social History:  reports that she has never smoked. She has never used smokeless tobacco. She reports current alcohol use of about 1.0 standard drink of alcohol per week. She reports that she does not use drugs.  Allergies: No Known Allergies  Family History:  Family History  Problem Relation Age of Onset   Cancer Mother        skin   Lung cancer Sister    Breast cancer Cousin    Colon cancer Neg Hx      Current Outpatient Medications:    atorvastatin (LIPITOR) 20 MG tablet, Take 1 tablet (20 mg total) by mouth daily., Disp: 90 tablet, Rfl: 1   Multiple Vitamins-Minerals (CENTRUM WOMEN PO), Take by mouth., Disp: , Rfl:    omeprazole (PRILOSEC) 20 MG capsule, TAKE 2 CAPSULES BY MOUTH EVERY DAY, Disp: 180 capsule, Rfl: 1  Review of Systems:  Negative unless indicated in HPI.   Physical Exam: Vitals:   09/12/22 1309  BP: 124/84  Pulse: 100  Temp: 98.4 F (36.9 C)  TempSrc: Oral  SpO2: 97%   Weight: 154 lb 11.2 oz (70.2 kg)    Body mass index is 29.23 kg/m.   Physical Exam Vitals reviewed.  Constitutional:      Appearance: Normal appearance.  HENT:     Head: Normocephalic and atraumatic.  Eyes:     Conjunctiva/sclera: Conjunctivae normal.     Pupils: Pupils are equal, round, and reactive to light.  Skin:    General: Skin is warm and dry.  Neurological:     General: No focal deficit present.     Mental Status: She is alert and oriented to person, place, and time.  Psychiatric:        Mood and Affect: Mood normal.        Behavior: Behavior normal.        Thought Content: Thought content normal.        Judgment: Judgment normal.      Impression and Plan:  Mixed hyperlipidemia Assessment & Plan: Started on atorvastatin 20 mg daily. Recheck lipids in 3 months.   Vitamin D deficiency  Gastroesophageal reflux disease without esophagitis Assessment & Plan: Well controlled. Continue omeprazole as needed.   Memory loss -     TSH; Future -     Vitamin B12; Future -     Ambulatory referral to Neuropsychology  Time spent:30 minutes reviewing chart, interviewing and examining patient and formulating plan of care.     Chaya Jan, MD [facility] at [facility]

## 2022-09-12 NOTE — Assessment & Plan Note (Signed)
Well-controlled. Continue omeprazole as needed. 

## 2022-09-20 ENCOUNTER — Encounter: Payer: Self-pay | Admitting: Physician Assistant

## 2022-10-01 ENCOUNTER — Ambulatory Visit: Payer: Medicare HMO

## 2022-10-02 ENCOUNTER — Telehealth: Payer: Self-pay | Admitting: *Deleted

## 2022-10-02 DIAGNOSIS — H9190 Unspecified hearing loss, unspecified ear: Secondary | ICD-10-CM

## 2022-10-02 MED ORDER — OMEPRAZOLE 20 MG PO CPDR: DELAYED_RELEASE_CAPSULE | ORAL | 1 refills | Status: DC

## 2022-10-02 NOTE — Telephone Encounter (Signed)
Refill sent.

## 2022-10-04 NOTE — Telephone Encounter (Signed)
Error

## 2022-10-07 ENCOUNTER — Ambulatory Visit
Admission: RE | Admit: 2022-10-07 | Discharge: 2022-10-07 | Disposition: A | Discharge status: Home / Self Care | Payer: Medicare HMO | Source: Ambulatory Visit | Attending: Internal Medicine | Admitting: Internal Medicine

## 2022-10-07 DIAGNOSIS — Z1231 Encounter for screening mammogram for malignant neoplasm of breast: Secondary | ICD-10-CM

## 2022-10-21 ENCOUNTER — Encounter: Payer: Self-pay | Admitting: Physician Assistant

## 2022-10-28 ENCOUNTER — Encounter: Payer: Self-pay | Admitting: Physician Assistant

## 2022-11-20 ENCOUNTER — Other Ambulatory Visit (INDEPENDENT_AMBULATORY_CARE_PROVIDER_SITE_OTHER): Payer: Medicare HMO

## 2022-11-20 ENCOUNTER — Encounter: Payer: Self-pay | Admitting: Physician Assistant

## 2022-11-20 ENCOUNTER — Ambulatory Visit: Payer: Medicare HMO | Admitting: Physician Assistant

## 2022-11-20 VITALS — BP 120/80 | HR 68 | Resp 18 | Ht 61.0 in

## 2022-11-20 DIAGNOSIS — R404 Transient alteration of awareness: Secondary | ICD-10-CM

## 2022-11-20 DIAGNOSIS — R413 Other amnesia: Secondary | ICD-10-CM | POA: Insufficient documentation

## 2022-11-20 NOTE — Progress Notes (Addendum)
Assessment/Plan:   Sandra Holt is a very pleasant 67 y.o. year old RH female originally from [place], Insurance claims handler, with a history of Vit D deficiency, hyperlipidemia, seen today for evaluation of memory loss. Spanish MoCA today is 30/30.  Patient is able to participate on his IADLs and continues to drive without any issues. Patient reports that most of her concerns are about distraction, and attributes her memory issues to "homesickness". She longs to be back in [place], and is caring for her sick husband which contributes to moments of memory lapse. If her MRI of the brain and EEG are normal, there low suspicion for cognitive disorder at this time .    Memory Difficulties   MRI brain without contrast to assess for underlying structural abnormality and assess vascular load  EEG  Check B1 Recommend psychotherapy for situational depression and anxiety  Consider changing omeprazole to an alternative medication (i.e. lansoprazole) as there is evidence that if taken greater than 3 years on a regular basis, can contribute to memory difficulties.  Continue to control mood as per PCP Recommend good control of cardiovascular risk factors Folllow up pending on the results of the MRI and EEG No indication for antidementia medication at this time   Subjective:   The patient is here alone   How long did patient have memory difficulties? For the last 2 years, without worsening. Patient has some difficulty remembering  people names, some words in both [place]. She reports trouble completing tasks that she used to do with ease as a Conservation officer, nature at [employer]. She loses attention more often than before. She participates in the hiking club and is going to [place], and gardening.  repeats oneself?  Endorsed, but not frequently  Disoriented when walking into a room?  Patient denies  Leaving objects in unusual places? denies   Wandering behavior?  denies   Any personality changes?  Patient denies    Any history of depression?:  She does not know, but she is getting more homesick than before. She was last in [place] in Oct 2023, which made her feel good, her husband is sick and he is 21 y older than her which interferes with her social life  Hallucinations or paranoia?  Patient denies   Seizures?   Patient denies    Any sleep changes?   Sleeps well, endorsed vivid dreams, denies REM behavior or sleepwalking   Sleep apnea?  Patient denies   Any hygiene concerns?  Patient denies   Independent of bathing and dressing?  Endorsed  Does the patient needs help with medications? Patient is in charge   Who is in charge of the finances? Husband  is in charge     Any changes in appetite? Since Covid in 2020 she has experienced decreased appetite..     Patient have trouble swallowing? Denies.   Does the patient cook? Yes     Any kitchen accidents such as leaving the stove on? Denies.   Any headaches?   denies   Chronic back pain ? denies   Ambulates with difficulty?  denies   Recent falls or head injuries? denies   Vision changes? denies   Unilateral weakness, numbness or tingling? denies   Any tremors?   denies   Any anosmia? Has not recuperated the sense of taste and smell since 2020 after CoVID Any incontinence of urine? denies   Any bowel dysfunction? denies      Patient lives with her husband  History of  heavy alcohol intake?occasionally at night 1-2 glasses wine History of heavy tobacco use? denies   Family history of dementia? denies  Does patient drive? Yes, denies getting lost      Past Medical History:  Diagnosis Date   GERD (gastroesophageal reflux disease)      Past Surgical History:  Procedure Laterality Date   CESAREAN SECTION     x2   COLONOSCOPY  2012   in [place]-normal exam per pt     No Known Allergies  Current Outpatient Medications  Medication Instructions   atorvastatin (LIPITOR) 20 mg, Oral, Daily   Multiple Vitamins-Minerals (CENTRUM WOMEN PO) Oral    omeprazole (PRILOSEC) 20 MG capsule TAKE 2 CAPSULES BY MOUTH EVERY DAY     VITALS:   Vitals:   11/20/22 1320  BP: 120/80  Pulse: 68  Resp: 18  SpO2: 98%  Height: 5\' 1"  (1.549 m)       PHYSICAL EXAM   HEENT:  Normocephalic, atraumatic. The superficial temporal arteries are without ropiness or tenderness. Cardiovascular: Regular rate and rhythm. Lungs: Clear to auscultation bilaterally. Neck: There are no carotid bruits noted bilaterally.  NEUROLOGICAL:    11/20/2022    6:00 PM  Montreal Cognitive Assessment   Visuospatial/ Executive (0/5) 5  Naming (0/3) 3  Attention: Read list of digits (0/2) 2  Attention: Read list of letters (0/1) 1  Attention: Serial 7 subtraction starting at 100 (0/3) 3  Language: Repeat phrase (0/2) 2  Language : Fluency (0/1) 1  Abstraction (0/2) 2  Delayed Recall (0/5) 5  Orientation (0/6) 6  Total 30  Adjusted Score (based on education) 30        No data to display           Orientation:  Alert and oriented to person, place and time. No aphasia or dysarthria. Fund of knowledge is appropriate. Recent memory impaired and remote memory intact.  Attention and concentration are normal.  Able to name objects and repeat phrases. Delayed recall  / Cranial nerves: There is good facial symmetry. Extraocular muscles are intact and visual fields are full to confrontational testing. Speech is fluent and clear. No tongue deviation. Hearing is intact to conversational tone. Tone: Tone is good throughout. Sensation: Sensation is intact to light touch. Vibration is intact at the bilateral big toe.  Coordination: The patient has no difficulty with RAM's or FNF bilaterally. Normal finger to nose  Motor: Strength is 5/5 in the bilateral upper and lower extremities. There is no pronator drift. There are no fasciculations noted. DTR's: Deep tendon reflexes are 2/4 bilaterally. Gait and Station: The patient is able to ambulate without difficulty.The patient is  able to heel toe walk without any difficulty. Gait is cautious and narrow. The patient is able to ambulate in a tandem fashion.       Thank you for allowing [place] the opportunity to participate in the care of this nice patient. Please do not hesitate to contact us for any questions or concerns.   Total time spent on today's visit was 55 minutes dedicated to this patient today, preparing to see patient, examining the patient, ordering tests and/or medications and counseling the patient, documenting clinical information in the EHR or other health record, independently interpreting results and communicating results to the patient/family, discussing treatment and goals, answering patient's questions and coordinating care.  Cc:  Philip Aspen, Limmie Patricia, MD  Marlowe Kays 11/20/2022 6:31 PM

## 2022-11-20 NOTE — Patient Instructions (Addendum)
It was a pleasure to see you today at our office.   Recommendations:  MRI of the brain, the radiology office will call you to arrange you appointment   Check labs today   EEG  Follow up pending on the results Recommend psychotherapy    For psychiatric meds, mood meds: Please have your primary care physician manage these medications.  If you have any severe symptoms of a stroke, or other severe issues such as confusion,severe chills or fever, etc call 911 or go to the ER as you may need to be evaluated further     For assessment of decision of mental capacity and competency:  Call Dr. Erick Blinks, geriatric psychiatrist at [phone]  Counseling regarding caregiver distress, including caregiver depression, anxiety and issues regarding community resources, adult day care programs, adult living facilities, or memory care questions:  please contact your  Primary Doctor's Social Worker   Whom to call: Memory  decline, memory medications: Call our office [phone]    [email or web address]   RECOMMENDATIONS FOR ALL PATIENTS WITH MEMORY PROBLEMS: 1. Continue to exercise (Recommend 30 minutes of walking everyday, or 3 hours every week) 2. Increase social interactions - continue going to [place] and enjoy social gatherings with friends and family 3. Eat healthy, avoid fried foods and eat more fruits and vegetables 4. Maintain adequate blood pressure, blood sugar, and blood cholesterol level. Reducing the risk of stroke and cardiovascular disease also helps promoting better memory. 5. Avoid stressful situations. Live a simple life and avoid aggravations. Organize your time and prepare for the next day in anticipation. 6. Sleep well, avoid any interruptions of sleep and avoid any distractions in the bedroom that may interfere with adequate sleep quality 7. Avoid sugar, avoid sweets as there is a strong link between excessive sugar  intake, diabetes, and cognitive impairment We discussed the Mediterranean diet, which has been shown to help patients reduce the risk of progressive memory disorders and reduces cardiovascular risk. This includes eating fish, eat fruits and green leafy vegetables, nuts like almonds and hazelnuts, walnuts, and also use olive oil. Avoid fast foods and fried foods as much as possible. Avoid sweets and sugar as sugar use has been linked to worsening of memory function.  There is always a concern of gradual progression of memory problems. If this is the case, then we may need to adjust level of care according to patient needs. Support, both to the patient and caregiver, should then be put into place.      You have been referred for a neuropsychological evaluation (i.e., evaluation of memory and thinking abilities). Please bring someone with you to this appointment if possible, as it is helpful for the doctor to hear from both you and another adult who knows you well. Please bring eyeglasses and hearing aids if you wear them.    The evaluation will take approximately 3 hours and has two parts:   The first part is a clinical interview with the neuropsychologist (Dr. Milbert Coulter or Dr. Roseanne Reno). During the interview, the neuropsychologist will speak with you and the individual you brought to the appointment.    The second part of the evaluation is testing with the doctor's technician Annabelle Harman or Selena Batten). During the testing, the technician will ask you to remember different types of material, solve problems, and answer some questionnaires. Your family member will not be present for this portion of the evaluation.   Please note: We must reserve several hours of the neuropsychologist's time and the  psychometrician's time for your evaluation appointment. As such, there is a No-Show fee of $100. If you are unable to attend any of your appointments, please contact our office as soon as possible to reschedule.       DRIVING: Regarding driving, in patients with progressive memory problems, driving will be impaired. We advise to have someone else do the driving if trouble finding directions or if minor accidents are reported. Independent driving assessment is available to determine safety of driving.   If you are interested in the driving assessment, you can contact the following:  [employer] in [place] [phone]  [facility] [phone]  [facility] [phone]  [facility] [phone] or [phone]   FALL PRECAUTIONS: Be cautious when walking. Scan the area for obstacles that may increase the risk of trips and falls. When getting up in the mornings, sit up at the edge of the bed for a few minutes before getting out of bed. Consider elevating the bed at the head end to avoid drop of blood pressure when getting up. Walk always in a well-lit room (use night lights in the walls). Avoid area rugs or power cords from appliances in the middle of the walkways. Use a walker or a cane if necessary and consider physical therapy for balance exercise. Get your eyesight checked regularly.  FINANCIAL OVERSIGHT: Supervision, especially oversight when making financial decisions or transactions is also recommended.  HOME SAFETY: Consider the safety of the kitchen when operating appliances like stoves, microwave oven, and blender. Consider having supervision and share cooking responsibilities until no longer able to participate in those. Accidents with firearms and other hazards in the house should be identified and addressed as well.   ABILITY TO BE LEFT ALONE: If patient is unable to contact 911 operator, consider using LifeLine, or when the need is there, arrange for someone to stay with patients. Smoking is a fire hazard, consider supervision or cessation. Risk of wandering should be assessed by caregiver and if detected at any point, supervision and  safe proof recommendations should be instituted.  MEDICATION SUPERVISION: Inability to self-administer medication needs to be constantly addressed. Implement a mechanism to ensure safe administration of the medications.      Mediterranean Diet A Mediterranean diet refers to food and lifestyle choices that are based on the traditions of countries located on the [employer]. This way of eating has been shown to help prevent certain conditions and improve outcomes for people who have chronic diseases, like kidney disease and heart disease. What are tips for following this plan? Lifestyle  Cook and eat meals together with your family, when possible. Drink enough fluid to keep your urine clear or pale yellow. Be physically active every day. This includes: Aerobic exercise like running or swimming. Leisure activities like gardening, walking, or housework. Get 7-8 hours of sleep each night. If recommended by your health care provider, drink red wine in moderation. This means 1 glass a day for nonpregnant women and 2 glasses a day for men. A glass of wine equals 5 oz (150 mL). Reading food labels  Check the serving size of packaged foods. For foods such as rice and pasta, the serving size refers to the amount of cooked product, not dry. Check the total fat in packaged foods. Avoid foods that have saturated fat or trans fats. Check the ingredients list for added sugars, such as corn syrup. Shopping  At the grocery store, buy most of your food from the areas  near the walls of the store. This includes: Fresh fruits and vegetables (produce). Grains, beans, nuts, and seeds. Some of these may be available in unpackaged forms or large amounts (in bulk). Fresh seafood. Poultry and eggs. Low-fat dairy products. Buy whole ingredients instead of prepackaged foods. Buy fresh fruits and vegetables in-season from local farmers markets. Buy frozen fruits and vegetables in resealable bags. If you do  not have access to quality fresh seafood, buy precooked frozen shrimp or canned fish, such as tuna, salmon, or sardines. Buy small amounts of raw or cooked vegetables, salads, or olives from the deli or salad bar at your store. Stock your pantry so you always have certain foods on hand, such as olive oil, canned tuna, canned tomatoes, rice, pasta, and beans. Cooking  Cook foods with extra-virgin olive oil instead of using butter or other vegetable oils. Have meat as a side dish, and have vegetables or grains as your main dish. This means having meat in small portions or adding small amounts of meat to foods like pasta or stew. Use beans or vegetables instead of meat in common dishes like chili or lasagna. Experiment with different cooking methods. Try roasting or broiling vegetables instead of steaming or sauteing them. Add frozen vegetables to soups, stews, pasta, or rice. Add nuts or seeds for added healthy fat at each meal. You can add these to yogurt, salads, or vegetable dishes. Marinate fish or vegetables using olive oil, lemon juice, garlic, and fresh herbs. Meal planning  Plan to eat 1 vegetarian meal one day each week. Try to work up to 2 vegetarian meals, if possible. Eat seafood 2 or more times a week. Have healthy snacks readily available, such as: Vegetable sticks with hummus. Greek yogurt. Fruit and nut trail mix. Eat balanced meals throughout the week. This includes: Fruit: 2-3 servings a day Vegetables: 4-5 servings a day Low-fat dairy: 2 servings a day Fish, poultry, or lean meat: 1 serving a day Beans and legumes: 2 or more servings a week Nuts and seeds: 1-2 servings a day Whole grains: 6-8 servings a day Extra-virgin olive oil: 3-4 servings a day Limit red meat and sweets to only a few servings a month What are my food choices? Mediterranean diet Recommended Grains: Whole-grain pasta. Brown rice. Bulgar wheat. Polenta. Couscous. Whole-wheat bread. Orpah Cobb. Vegetables: Artichokes. Beets. Broccoli. Cabbage. Carrots. Eggplant. Green beans. Chard. Kale. Spinach. Onions. Leeks. Peas. Squash. Tomatoes. Peppers. Radishes. Fruits: Apples. Apricots. Avocado. Berries. Bananas. Cherries. Dates. Figs. Grapes. Lemons. Melon. Oranges. Peaches. Plums. Pomegranate. Meats and other protein foods: Beans. Almonds. Sunflower seeds. Pine nuts. Peanuts. Cod. Salmon. Scallops. Shrimp. Tuna. Tilapia. Clams. Oysters. Eggs. Dairy: Low-fat milk. Cheese. Greek yogurt. Beverages: Water. Red wine. Herbal tea. Fats and oils: Extra virgin olive oil. Avocado oil. Grape seed oil. Sweets and desserts: [place] yogurt with honey. Baked apples. Poached pears. Trail mix. Seasoning and other foods: Basil. Cilantro. Coriander. Cumin. Mint. Parsley. Sage. Rosemary. Tarragon. Garlic. Oregano. Thyme. Pepper. Balsalmic vinegar. Tahini. Hummus. Tomato sauce. Olives. Mushrooms. Limit these Grains: Prepackaged pasta or rice dishes. Prepackaged cereal with added sugar. Vegetables: Deep fried potatoes (french fries). Fruits: Fruit canned in syrup. Meats and other protein foods: Beef. Pork. Lamb. Poultry with skin. Hot dogs. Tomasa Blase. Dairy: Ice cream. Sour cream. Whole milk. Beverages: Juice. Sugar-sweetened soft drinks. Beer. Liquor and spirits. Fats and oils: Butter. Canola oil. Vegetable oil. Beef fat (tallow). Lard. Sweets and desserts: Cookies. Cakes. Pies. Candy. Seasoning and other foods: Mayonnaise. Premade sauces and marinades.  The items listed may not be a complete list. Talk with your dietitian about what dietary choices are right for you. Summary The Mediterranean diet includes both food and lifestyle choices. Eat a variety of fresh fruits and vegetables, beans, nuts, seeds, and whole grains. Limit the amount of red meat and sweets that you eat. Talk with your health care provider about whether it is safe for you to drink red wine in moderation. This means 1 glass a day for  nonpregnant women and 2 glasses a day for men. A glass of wine equals 5 oz (150 mL). This information is not intended to replace advice given to you by your health care provider. Make sure you discuss any questions you have with your health care provider. Document Released: 12/28/2015 Document Revised: 01/30/2016 Document Reviewed: 12/28/2015 Elsevier Interactive Patient Education  2017 [employer].    [facility] at [facility] [phone] Labs today [address]

## 2022-11-26 LAB — VITAMIN B1: Vitamin B1 (Thiamine): 16 nmol/L (ref 8–30)

## 2022-11-26 NOTE — Progress Notes (Signed)
B1 is normal , thanks

## 2022-11-28 ENCOUNTER — Other Ambulatory Visit: Payer: Medicare HMO

## 2022-12-09 ENCOUNTER — Other Ambulatory Visit: Payer: Self-pay | Admitting: *Deleted

## 2022-12-09 ENCOUNTER — Other Ambulatory Visit: Payer: Medicare HMO

## 2022-12-09 DIAGNOSIS — E782 Mixed hyperlipidemia: Secondary | ICD-10-CM

## 2022-12-20 ENCOUNTER — Other Ambulatory Visit (INDEPENDENT_AMBULATORY_CARE_PROVIDER_SITE_OTHER): Payer: Medicare HMO

## 2022-12-20 DIAGNOSIS — E782 Mixed hyperlipidemia: Secondary | ICD-10-CM

## 2022-12-20 LAB — LIPID PANEL
Cholesterol: 165 mg/dL (ref 0–200)
HDL: 59.6 mg/dL (ref >39.00)
LDL Cholesterol: 74 mg/dL (ref 0–99)
NonHDL: 105.73
Total CHOL/HDL Ratio: 3
Triglycerides: 161 mg/dL — ABNORMAL HIGH (ref 0.0–149.0)
VLDL: 32.2 mg/dL (ref 0.0–40.0)

## 2022-12-26 ENCOUNTER — Telehealth: Payer: Self-pay | Admitting: Internal Medicine

## 2022-12-26 MED ORDER — ATORVASTATIN CALCIUM 20 MG PO TABS: 20.0000 mg | ORAL_TABLET | Freq: Every day | ORAL | 1 refills | Status: DC

## 2022-12-26 NOTE — Telephone Encounter (Signed)
Refill sent.

## 2022-12-26 NOTE — Telephone Encounter (Signed)
Prescription Request  12/26/2022  LOV: 09/12/2022  What is the name of the medication or equipment? atorvastatin (LIPITOR) 20 MG tablet   Pt states she is completely out of this Rx.  Pt is aware MD is OOO today.   Have you contacted your pharmacy to request a refill? No   Which pharmacy would you like this sent to?  [id number] [place], [place] - [address] [address] Phone: [phone] Fax: [phone]    Patient notified that their request is being sent to the clinical staff for review and that they should receive a response within 2 business days.   Please advise at Mobile [phone] (mobile)

## 2023-01-14 ENCOUNTER — Ambulatory Visit: Payer: Medicare HMO | Attending: Internal Medicine | Admitting: Audiology

## 2023-01-14 DIAGNOSIS — H90A12 Conductive hearing loss, unilateral, left ear with restricted hearing on the contralateral side: Secondary | ICD-10-CM | POA: Diagnosis not present

## 2023-01-14 NOTE — Procedures (Signed)
Outpatient Audiology and [facility] [address] [place], [place]  [address] [phone]  AUDIOLOGICAL  EVALUATION  NAME: Sandra Holt     DOB:   [birth year]      MRN: [id number]                                                                                     DATE: 01/14/2023     REFERENT: Philip Aspen, Limmie Patricia, MD STATUS: Outpatient DIAGNOSIS: Conductive hearing loss of the left ear  History: [name] was seen for an audiological evaluation due to decreased hearing in the left ear.  She was accompanied to the appointment by a Spanish interpreter. [name] reports a history of decreased hearing in the left ear and she reports it started at the age of 72 due to an eardrum perforation.  She reports she underwent an audiological evaluation around 5 years ago at which time they recommended hearing aids and she reported she was not ready to pursue hearing aids at that time. [name] denies otalgia, aural fullness, and tinnitus.  She reports a history of vertigo but has not had any episodes of vertigo in many years. [name] reports she has been trying to learn English for 15 years and reports she has been having difficulty and is concerned it is related to her hearing sensitivity.  Evaluation:  Otoscopy showed non-occluding cerumen, bilaterally Tympanometry results were consistent in the right ear with normal middle ear pressure and normal tympanic membrane mobility (Type A).  Tympanometry results were consistent in the left ear with no tympanic membrane mobility and a large ear canal volume indicating a perforation (Type B).  Audiometric testing was completed using Conventional Audiometry techniques with insert earphones and TDH headphones. Test results are consistent in the right ear with essentially normal hearing sensitivity with the exception of a mild hearing loss at 8000 Hz.  Test results are consistent in the left ear with a mild conductive hearing loss. Speech  Recognition Thresholds were obtained at 15 dB HL in the right ear and at 25  dB HL in the left ear. Word Recognition Testing was not completed as English is her second language.    Results:  The test results were reviewed with [name] via the Spanish interpreter.  Test results from tympanometry show normal middle ear function in the right ear and no tympanic membrane mobility with a large ear canal volume in the left ear indicating a possible tympanic membrane perforation. audiometric est results are consistent in the right ear with essentially normal hearing sensitivity with the exception of a mild hearing loss at 8000 Hz.  Test results are consistent in the left ear with a mild conductive hearing loss. [name] will have hearing aids indication difficulty in adverse listening environments.  She will benefit from the use of good communication strategies.  She is not a hearing aid candidate at this time.  Recommendations: 1.   Referral to an ENT due left tympanic membrane perforation 2.   Return in 2 to 3 years for an audiological evaluation to monitor hearing sensitivity.   30 minutes spent testing and counseling on results.   If you have any questions  please feel free to contact me at [phone].  Marton Redwood Audiologist, Au.D., CCC-A 01/14/2023  2:24 PM  Cc: Philip Aspen, Limmie Patricia, MD

## 2023-01-31 ENCOUNTER — Telehealth: Payer: Self-pay

## 2023-01-31 NOTE — Telephone Encounter (Signed)
Mri brain wo contrast was not done, multiple attempts to reach patient from [facility]. FYI. Unable to reach patient.

## 2023-04-19 ENCOUNTER — Other Ambulatory Visit: Payer: Self-pay | Admitting: Internal Medicine

## 2023-05-18 IMAGING — MG MM DIGITAL SCREENING BILAT W/ TOMO AND CAD
6 of 10 series · 6 of 30 positions shown · non-contrast
Comparison: Previous exam(s).

CLINICAL DATA: Screening.

EXAM:
DIGITAL SCREENING BILATERAL MAMMOGRAM WITH TOMOSYNTHESIS AND CAD
TECHNIQUE: Bilateral screening digital craniocaudal and mediolateral oblique
mammograms were obtained. Bilateral screening digital breast
tomosynthesis was performed. The images were evaluated with
computer-aided detection.

[R CC synth-2D]
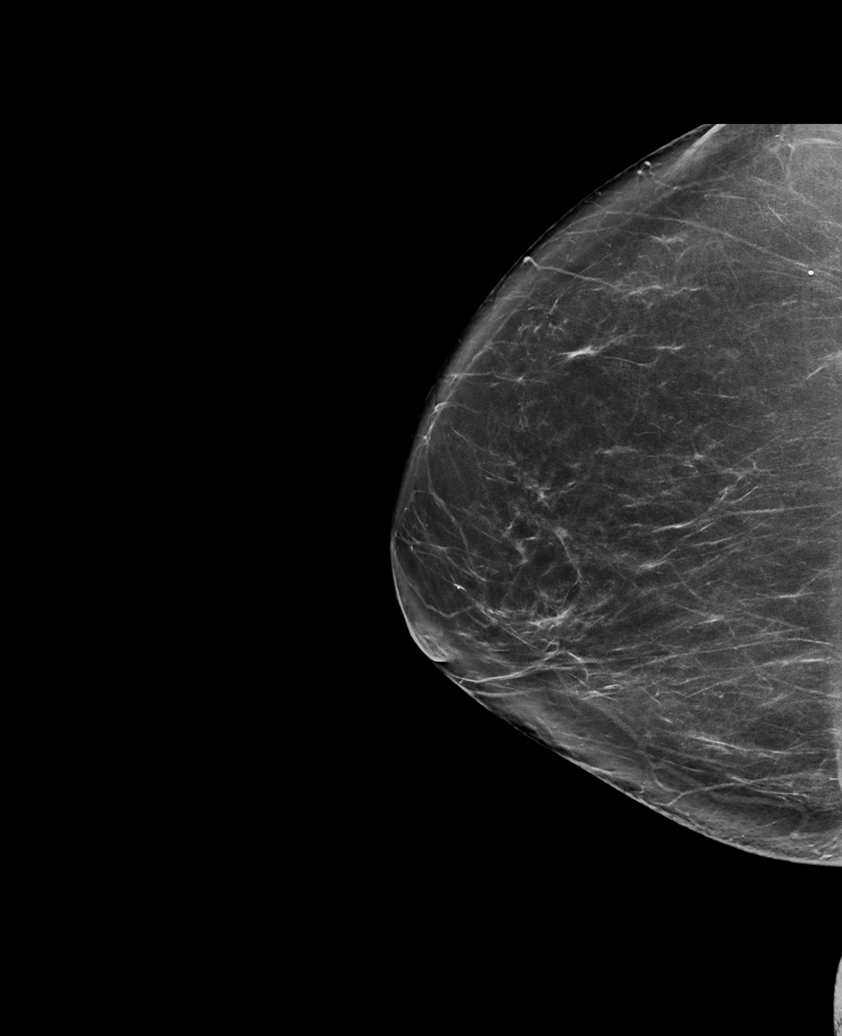

[L CC synth-2D]
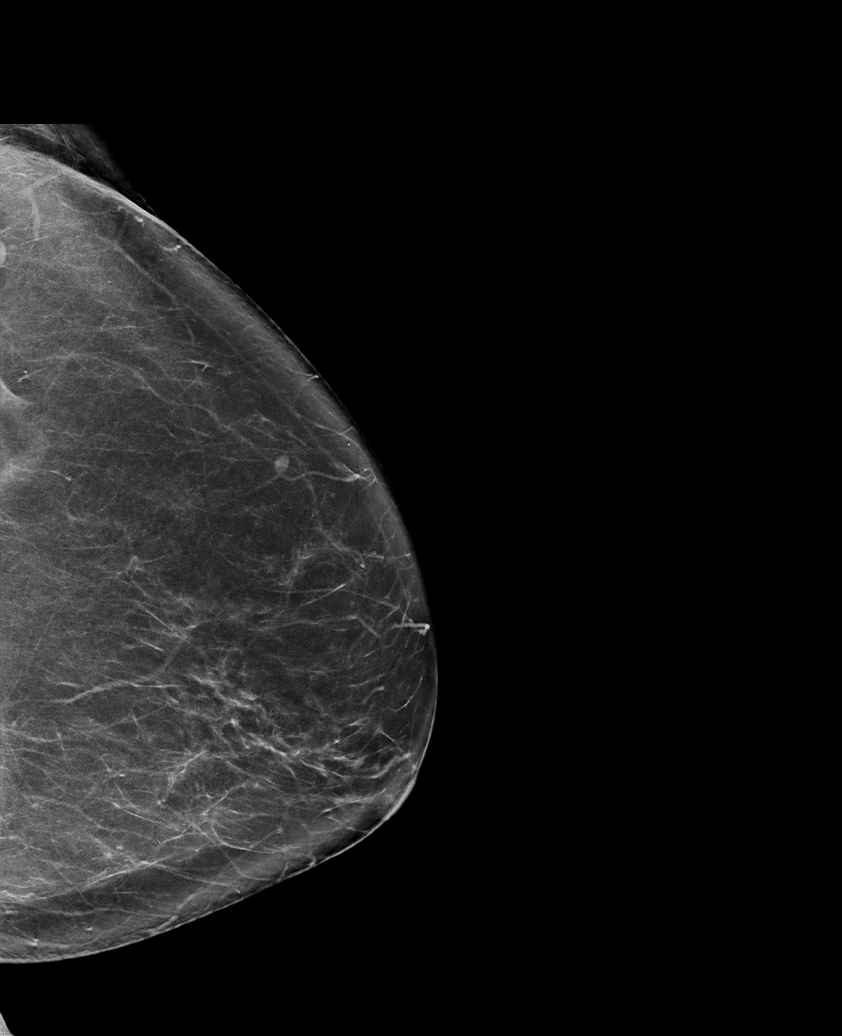

[R MLO synth-2D (1 of 2)]
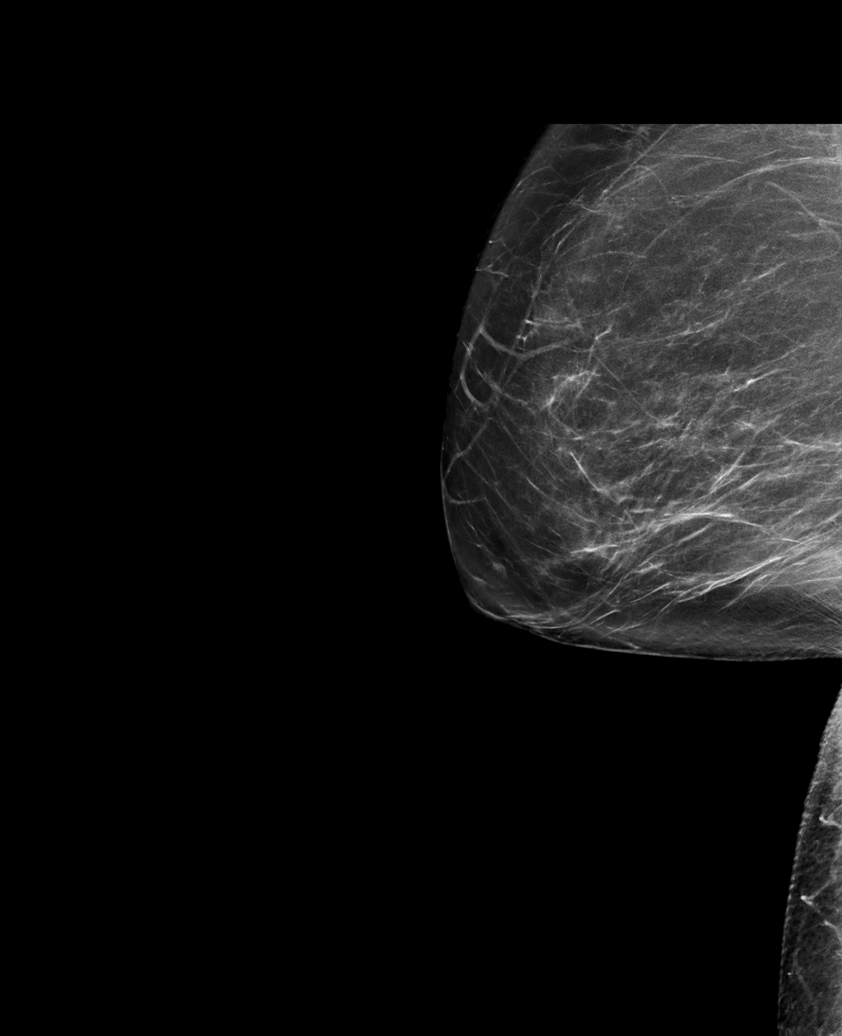

[L MLO synth-2D]
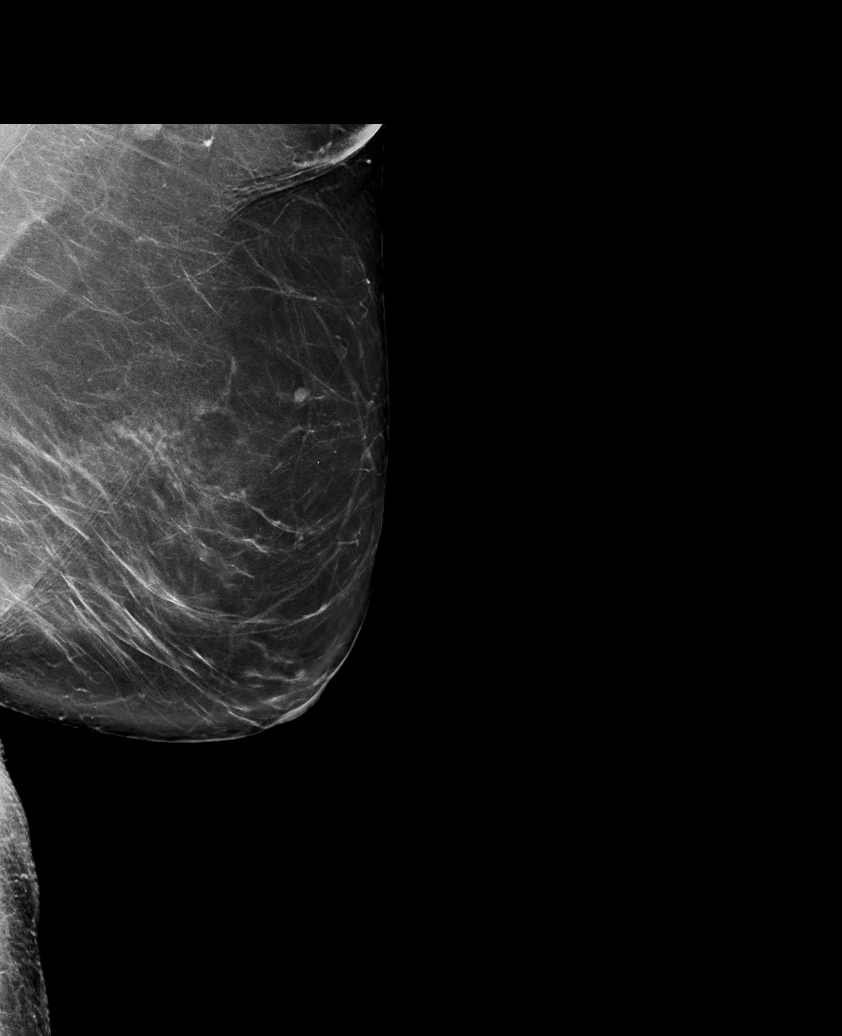

[R MLO synth-2D (2 of 2)]
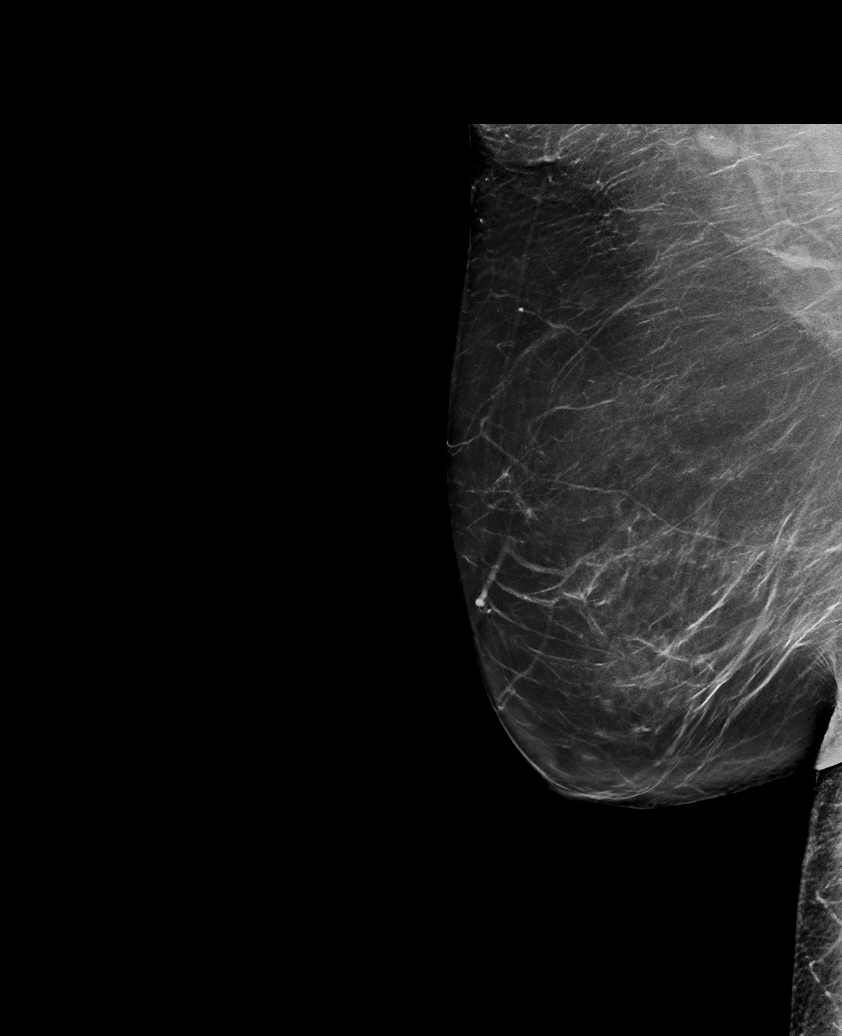

[R MLO tomo · tomo slice 43/86.0]
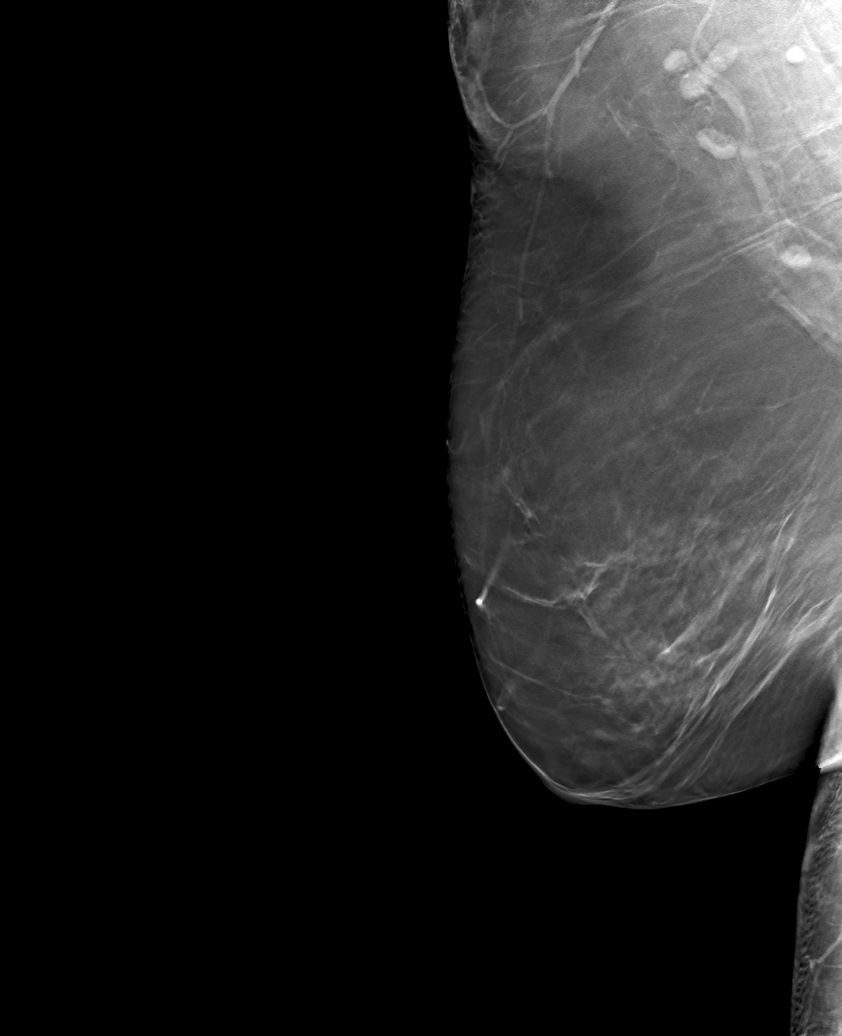

[6 of 30 positions shown; findings below may reference images not displayed]

ACR Breast Density Category b: There are scattered areas of
fibroglandular density.
FINDINGS: There are no findings suspicious for malignancy.
IMPRESSION: No mammographic evidence of malignancy. A result letter of this
screening mammogram will be mailed directly to the patient.

RECOMMENDATION:
Screening mammogram in one year. (Code:51-O-LD2)

BI-RADS CATEGORY  1: Negative.

## 2023-07-31 ENCOUNTER — Other Ambulatory Visit: Payer: Self-pay | Admitting: Internal Medicine

## 2023-09-29 ENCOUNTER — Encounter: Payer: Self-pay | Admitting: Internal Medicine

## 2023-09-29 ENCOUNTER — Ambulatory Visit (INDEPENDENT_AMBULATORY_CARE_PROVIDER_SITE_OTHER): Payer: Self-pay | Admitting: Internal Medicine

## 2023-09-29 VITALS — BP 110/74 | HR 84 | Temp 98.0°F | Wt 158.3 lb

## 2023-09-29 DIAGNOSIS — J209 Acute bronchitis, unspecified: Secondary | ICD-10-CM

## 2023-09-29 DIAGNOSIS — Z09 Encounter for follow-up examination after completed treatment for conditions other than malignant neoplasm: Secondary | ICD-10-CM

## 2023-09-29 NOTE — Progress Notes (Signed)
     Established Patient Office Visit     CC/Reason for Visit: Follow-up acute bronchitis  HPI: Sandra Holt is a 68 y.o. female who is coming in today for the above mentioned reasons.  Last month she was on a trip in [place].  When they arrived to [place] she developed significant cough, shortness of breath with exertion.  She was found to have acute bronchitis.  She was treated with steroids, antibiotics and inhalers.  She feels back to baseline.   Past Medical/Surgical History: Past Medical History:  Diagnosis Date   GERD (gastroesophageal reflux disease)     Past Surgical History:  Procedure Laterality Date   CESAREAN SECTION     x2   COLONOSCOPY  2012   in [place] -normal exam per pt    Social History:  reports that she has never smoked. She has never used smokeless tobacco. She reports current alcohol use of about 1.0 standard drink of alcohol per week. She reports that she does not use drugs.  Allergies: No Known Allergies  Family History:  Family History  Problem Relation Age of Onset   Cancer Mother        skin   Lung cancer Sister    Breast cancer Cousin    Colon cancer Neg Hx      Current Outpatient Medications:    atorvastatin  (LIPITOR) 20 MG tablet, TAKE 1 TABLET BY MOUTH EVERY DAY, Disp: 90 tablet, Rfl: 1   Multiple Vitamins-Minerals (CENTRUM WOMEN PO), Take by mouth., Disp: , Rfl:    omeprazole  (PRILOSEC) 20 MG capsule, TAKE 2 CAPSULES BY MOUTH EVERY DAY, Disp: 180 capsule, Rfl: 1  Review of Systems:  Negative unless indicated in HPI.   Physical Exam: Vitals:   09/29/23 1102  BP: 110/74  Pulse: 84  Temp: 98 F (36.7 C)  TempSrc: Oral  SpO2: 98%  Weight: 158 lb 4.8 oz (71.8 kg)    Body mass index is 29.91 kg/m.   Physical Exam Vitals reviewed.  Constitutional:      Appearance: Normal appearance.  HENT:     Head: Normocephalic and atraumatic.  Eyes:     Conjunctiva/sclera: Conjunctivae normal.  Cardiovascular:     Rate and  Rhythm: Normal rate and regular rhythm.  Pulmonary:     Effort: Pulmonary effort is normal.     Breath sounds: Normal breath sounds.  Skin:    General: Skin is warm and dry.  Neurological:     General: No focal deficit present.     Mental Status: She is alert and oriented to person, place, and time.  Psychiatric:        Mood and Affect: Mood normal.        Behavior: Behavior normal.        Thought Content: Thought content normal.        Judgment: Judgment normal.      Impression and Plan:  Hospital discharge follow-up  Acute bronchitis, unspecified organism   - She is no longer short of breath and feels back to baseline.  Time spent:22 minutes reviewing chart, interviewing and examining patient and formulating plan of care.     Marguerita Shih, MD [facility]

## 2023-11-18 ENCOUNTER — Other Ambulatory Visit: Payer: Self-pay | Admitting: Internal Medicine

## 2023-11-18 DIAGNOSIS — Z1231 Encounter for screening mammogram for malignant neoplasm of breast: Secondary | ICD-10-CM

## 2023-11-24 ENCOUNTER — Ambulatory Visit
Admission: RE | Admit: 2023-11-24 | Discharge: 2023-11-24 | Disposition: A | Discharge status: Home / Self Care | Source: Ambulatory Visit | Attending: Internal Medicine | Admitting: Internal Medicine

## 2023-11-24 DIAGNOSIS — Z1231 Encounter for screening mammogram for malignant neoplasm of breast: Secondary | ICD-10-CM

## 2023-12-24 ENCOUNTER — Other Ambulatory Visit: Payer: Self-pay | Admitting: Internal Medicine

## 2024-01-30 ENCOUNTER — Other Ambulatory Visit: Payer: Self-pay | Admitting: Internal Medicine

## 2024-02-05 ENCOUNTER — Other Ambulatory Visit: Payer: Self-pay | Admitting: *Deleted

## 2024-02-05 MED ORDER — ATORVASTATIN CALCIUM 20 MG PO TABS: 20.0000 mg | ORAL_TABLET | Freq: Every day | ORAL | 1 refills | Status: AC

## 2024-03-20 ENCOUNTER — Other Ambulatory Visit: Payer: Self-pay | Admitting: Internal Medicine

## 2024-06-25 ENCOUNTER — Other Ambulatory Visit: Payer: Self-pay | Admitting: Internal Medicine
